# Patient Record
Sex: Female | Born: 1980
Health system: Southern US, Community
[De-identification: ages and names within clinical notes are randomized; demographics above are authoritative.]

## PROBLEM LIST (undated history)

## (undated) DIAGNOSIS — N200 Calculus of kidney: Secondary | ICD-10-CM

## (undated) DIAGNOSIS — G43909 Migraine, unspecified, not intractable, without status migrainosus: Secondary | ICD-10-CM

## (undated) HISTORY — PX: NO PAST SURGERIES: SHX2092

---

## 2018-07-17 ENCOUNTER — Emergency Department (HOSPITAL_BASED_OUTPATIENT_CLINIC_OR_DEPARTMENT_OTHER)
Admission: EM | Admit: 2018-07-17 | Discharge: 2018-07-17 | Disposition: A | Discharge status: Home / Self Care | Payer: 59 | Attending: Emergency Medicine | Admitting: Emergency Medicine

## 2018-07-17 ENCOUNTER — Encounter (HOSPITAL_BASED_OUTPATIENT_CLINIC_OR_DEPARTMENT_OTHER): Payer: Self-pay | Admitting: Emergency Medicine

## 2018-07-17 ENCOUNTER — Other Ambulatory Visit: Payer: Self-pay

## 2018-07-17 ENCOUNTER — Emergency Department (HOSPITAL_BASED_OUTPATIENT_CLINIC_OR_DEPARTMENT_OTHER): Payer: 59

## 2018-07-17 DIAGNOSIS — R1084 Generalized abdominal pain: Secondary | ICD-10-CM | POA: Diagnosis present

## 2018-07-17 DIAGNOSIS — D259 Leiomyoma of uterus, unspecified: Secondary | ICD-10-CM

## 2018-07-17 DIAGNOSIS — N200 Calculus of kidney: Secondary | ICD-10-CM

## 2018-07-17 DIAGNOSIS — N132 Hydronephrosis with renal and ureteral calculous obstruction: Secondary | ICD-10-CM | POA: Diagnosis not present

## 2018-07-17 HISTORY — DX: Migraine, unspecified, not intractable, without status migrainosus: G43.909

## 2018-07-17 LAB — CBC WITH DIFFERENTIAL/PLATELET
BASOS ABS: 0 10*3/uL (ref 0.0–0.1)
BASOS PCT: 0 %
Eosinophils Absolute: 0.1 10*3/uL (ref 0.0–0.7)
Eosinophils Relative: 0 %
HEMATOCRIT: 39.4 % (ref 36.0–46.0)
HEMOGLOBIN: 13.7 g/dL (ref 12.0–15.0)
Lymphocytes Relative: 15 %
Lymphs Abs: 2.1 10*3/uL (ref 0.7–4.0)
MCH: 29.3 pg (ref 26.0–34.0)
MCHC: 34.8 g/dL (ref 30.0–36.0)
MCV: 84.2 fL (ref 78.0–100.0)
Monocytes Absolute: 1 10*3/uL (ref 0.1–1.0)
Monocytes Relative: 7 %
NEUTROS ABS: 11.4 10*3/uL — AB (ref 1.7–7.7)
NEUTROS PCT: 78 %
Platelets: 404 10*3/uL — ABNORMAL HIGH (ref 150–400)
RBC: 4.68 MIL/uL (ref 3.87–5.11)
RDW: 14.2 % (ref 11.5–15.5)
WBC: 14.6 10*3/uL — ABNORMAL HIGH (ref 4.0–10.5)

## 2018-07-17 LAB — COMPREHENSIVE METABOLIC PANEL
ALK PHOS: 76 U/L (ref 38–126)
ALT: 24 U/L (ref 0–44)
AST: 22 U/L (ref 15–41)
Albumin: 4.3 g/dL (ref 3.5–5.0)
Anion gap: 10 (ref 5–15)
BILIRUBIN TOTAL: 0.5 mg/dL (ref 0.3–1.2)
BUN: 13 mg/dL (ref 6–20)
CO2: 24 mmol/L (ref 22–32)
Calcium: 9.3 mg/dL (ref 8.9–10.3)
Chloride: 103 mmol/L (ref 98–111)
Creatinine, Ser: 0.87 mg/dL (ref 0.44–1.00)
GFR calc Af Amer: >60 mL/min (ref >60)
GFR calc non Af Amer: >60 mL/min (ref >60)
GLUCOSE: 101 mg/dL — AB (ref 70–99)
POTASSIUM: 4.1 mmol/L (ref 3.5–5.1)
Sodium: 137 mmol/L (ref 135–145)
TOTAL PROTEIN: 8.1 g/dL (ref 6.5–8.1)

## 2018-07-17 LAB — URINALYSIS, ROUTINE W REFLEX MICROSCOPIC
Bilirubin Urine: NEGATIVE
Glucose, UA: NEGATIVE mg/dL
Ketones, ur: NEGATIVE mg/dL
NITRITE: NEGATIVE
PROTEIN: NEGATIVE mg/dL
Specific Gravity, Urine: <1.005 — ABNORMAL LOW (ref 1.005–1.030)
pH: 7 (ref 5.0–8.0)

## 2018-07-17 LAB — URINALYSIS, MICROSCOPIC (REFLEX)

## 2018-07-17 LAB — LIPASE, BLOOD: Lipase: 30 U/L (ref 11–51)

## 2018-07-17 LAB — PREGNANCY, URINE: PREG TEST UR: NEGATIVE

## 2018-07-17 MED ORDER — KETOROLAC TROMETHAMINE 30 MG/ML IJ SOLN
30.0000 mg | Freq: Once | INTRAMUSCULAR | Status: AC
  Administered 2018-07-17: 30 mg via INTRAVENOUS
  Filled 2018-07-17: qty 1

## 2018-07-17 MED ORDER — SODIUM CHLORIDE 0.9 % IV BOLUS
1000.0000 mL | Freq: Once | INTRAVENOUS | Status: AC
  Administered 2018-07-17: 1000 mL via INTRAVENOUS

## 2018-07-17 MED ORDER — ONDANSETRON 4 MG PO TBDP: 4.0000 mg | ORAL_TABLET | Freq: Three times a day (TID) | ORAL | 0 refills | Status: DC | PRN

## 2018-07-17 MED ORDER — TRAMADOL HCL 50 MG PO TABS: 50.0000 mg | ORAL_TABLET | Freq: Four times a day (QID) | ORAL | 0 refills | Status: AC | PRN

## 2018-07-17 MED ORDER — CEPHALEXIN 500 MG PO CAPS: 500.0000 mg | ORAL_CAPSULE | Freq: Three times a day (TID) | ORAL | 0 refills | Status: AC

## 2018-07-17 MED FILL — CEPHALEXIN 500 MG CAPSULE: 500 | 5 days supply | Qty: 15 | Fill #0

## 2018-07-17 MED FILL — traMADol HCL 50 MG TABS: 50 | 4 days supply | Qty: 15 | Fill #0

## 2018-07-17 MED FILL — ONDANSETRON ODT 4 MG TABLET: 4 | 7 days supply | Qty: 20 | Fill #0

## 2018-07-17 NOTE — ED Triage Notes (Signed)
Pt is having back pain and abdominal pain that made her nauseous. She took magnesia without relief

## 2018-07-17 NOTE — ED Notes (Signed)
ED Provider at bedside. 

## 2018-07-17 NOTE — ED Provider Notes (Signed)
[facility]ER[facility] DEPARTMENT Provider Note   CSN: [id number] Arrival date & time: 07/17/18  0734     History   Chief Complaint Chief Complaint  Patient presents with  . Abdominal Pain    HPI Jamie Preston is a 37 y.o. female.  HPI   37yo female with history of migraines presents with concern for flank pain.  Pain started last night around 8, rated 8/10/waxing and waning down to 6/10, described as cramping.  Pain is located left flank and radiation to left suprapubic area.  Feels better walking. Tried milk of magnesia without relief. Passing gas. Had BM at [place], nothing since. Denies nausea, vomiting, urinary symptoms, vaginal discharge or bleeding. Sister has hx of nephrolithiasis. No personal hx of this. No hx of abd surgeries.      Past Medical History:  Diagnosis Date  . Migraine     There are no active problems to display for this patient.   History reviewed. No pertinent surgical history.   OB History   None      Home Medications    Prior to Admission medications   Medication Sig Start Date End Date Taking? Authorizing Provider  cephALEXin (KEFLEX) 500 MG capsule Take 1 capsule (500 mg total) by mouth 3 (three) times daily for 5 days. 07/17/18 07/22/18  Gareth Morgan, MD  ondansetron (ZOFRAN ODT) 4 MG disintegrating tablet Take 1 tablet (4 mg total) by mouth every 8 (eight) hours as needed for nausea or vomiting. 07/17/18   Gareth Morgan, MD  traMADol (ULTRAM) 50 MG tablet Take 1 tablet (50 mg total) by mouth every 6 (six) hours as needed. 07/17/18   Gareth Morgan, MD    Family History No family history on file.  Social History Social History   Tobacco Use  . Smoking status: Never Smoker  . Smokeless tobacco: Never Used  Substance Use Topics  . Alcohol use: Never    Frequency: Never  . Drug use: Never     Allergies   Percocet [oxycodone-acetaminophen]   Review of Systems Review of Systems  Constitutional: Negative for fever.    HENT: Negative for sore throat.   Eyes: Negative for visual disturbance.  Respiratory: Negative for cough and shortness of breath.   Cardiovascular: Negative for chest pain.  Gastrointestinal: Positive for abdominal pain. Negative for constipation, diarrhea, nausea and vomiting.  Genitourinary: Positive for flank pain. Negative for difficulty urinating, vaginal bleeding and vaginal discharge.  Musculoskeletal: Negative for back pain and neck pain.  Skin: Negative for rash.  Neurological: Negative for syncope and headaches.     Physical Exam Updated Vital Signs BP (!) 156/95 (BP Location: Right Arm)   Pulse 77   Temp 98.1 F (36.7 C) (Oral)   Resp 18   Ht 5\' 7"  (1.702 m)   Wt 108.9 kg   LMP 07/15/2018   SpO2 99%   BMI 37.59 kg/m   Physical Exam  Constitutional: She is oriented to person, place, and time. She appears well-developed and well-nourished. No distress.  HENT:  Head: Normocephalic and atraumatic.  Eyes: Conjunctivae and EOM are normal.  Neck: Normal range of motion.  Cardiovascular: Normal rate, regular rhythm, normal heart sounds and intact distal pulses. Exam reveals no gallop and no friction rub.  No murmur heard. Pulmonary/Chest: Effort normal and breath sounds normal. No respiratory distress. She has no wheezes. She has no rales.  Abdominal: Soft. She exhibits no distension. There is no tenderness. There is CVA tenderness (left). There is no  guarding.  Musculoskeletal: She exhibits no edema or tenderness.  Neurological: She is alert and oriented to person, place, and time.  Skin: Skin is warm and dry. No rash noted. She is not diaphoretic. No erythema.  Nursing note and vitals reviewed.    ED Treatments / Results  Labs (all labs ordered are listed, but only abnormal results are displayed) Labs Reviewed  URINALYSIS, ROUTINE W REFLEX MICROSCOPIC - Abnormal; Notable for the following components:      Result Value   Specific Gravity, Urine <1.005 (*)     Hgb urine dipstick MODERATE (*)    Leukocytes, UA SMALL (*)    All other components within normal limits  CBC WITH DIFFERENTIAL/PLATELET - Abnormal; Notable for the following components:   WBC 14.6 (*)    Platelets 404 (*)    Neutro Abs 11.4 (*)    All other components within normal limits  COMPREHENSIVE METABOLIC PANEL - Abnormal; Notable for the following components:   Glucose, Bld 101 (*)    All other components within normal limits  URINALYSIS, MICROSCOPIC (REFLEX) - Abnormal; Notable for the following components:   Bacteria, UA FEW (*)    All other components within normal limits  URINE CULTURE  PREGNANCY, URINE  LIPASE, BLOOD    EKG None  Radiology Ct Renal Stone Study  Result Date: 07/17/2018 CLINICAL DATA:  Abdominal and flank pain EXAM: CT ABDOMEN AND PELVIS WITHOUT CONTRAST TECHNIQUE: Multidetector CT imaging of the abdomen and pelvis was performed following the standard protocol without oral or IV contrast. COMPARISON:  None. FINDINGS: Lower chest: There is atelectatic change in the visualized right lower lobe. Lung bases elsewhere clear. Hepatobiliary: There is evidence of a degree of hepatic steatosis. No focal liver lesions are evident. Gallbladder wall is not appreciably thickened. There is no biliary duct dilatation. Pancreas: No pancreatic mass or inflammatory focus. Spleen: No splenic lesions are evident. Adrenals/Urinary Tract: Right adrenal appears normal. There is a 1 x 1 cm adenoma arising from medial left adrenal. The left kidney is edematous. There is extensive perinephric fluid on the left. There is moderate hydronephrosis on the left. There is no evident renal mass on either side. There is a 1 mm calculus in the upper pole of the right kidney. There is a 4 x 4 mm calculus in the mid right kidney. There is a 1 mm calculus in the lower pole right kidney. There is a 1 mm calculus in the lower pole of the left kidney. There is a calculus in the proximal left ureter  measuring 3 x 3 mm at the level of L3-4. No other ureteral calculi are evident on either side. Urinary bladder is midline with wall thickness within normal limits. Stomach/Bowel: There is no appreciable bowel wall or mesenteric thickening. No evident bowel obstruction. No free air or portal venous air. Vascular/Lymphatic: No abdominal aortic aneurysm. No vascular lesions are evident on this noncontrast enhanced study. There is a retroaortic left renal vein, an anatomic variant. There is no adenopathy in the abdomen or pelvis. Subcentimeter inguinal lymph nodes are considered nonspecific. Reproductive: Uterus is anteverted. Uterus is enlarged with several mass lesions evident on this noncontrast enhanced CT consistent with diffuse leiomyomatous change. It is difficult to confidently separate ovaries from uterus. There are what appear to be cystic areas, likely arising from the ovaries, measuring as large as 3 x 3 cm. Other: Appendix appears normal. No abscess or ascites is evident in the abdomen or pelvis. There is a minimal ventral  hernia containing only fat. Musculoskeletal: There is degenerative change in the lumbar spine with vacuum phenomenon at L5-S1. There are no blastic or lytic bone lesions. No intramuscular lesions are evident. IMPRESSION: 1. 3 mm calculus at L3-4 in the proximal left ureter. Moderate hydronephrosis and proximal ureterectasis noted. Note that the left kidney is edematous with perinephric fluid. Suspect recent left-sided pelvicalyceal rupture accounting for the fluid. 2. Nonobstructing calculi in each kidney, more on the right than on the left. 3. Enlarged leiomyomatous uterus. Mildly prominent cystic areas which may arise from adjacent ovaries. It is difficult to separate ovaries from uterus on this study. It is possible that the cystic areas arise from eccentric leiomyomas from the uterus as opposed to ovaries. Pelvic ultrasound nonemergently to further evaluate may be helpful in this  regard. 4.  Hepatic steatosis. 5. No evident bowel obstruction. No abscess in the abdomen or pelvis. Appendix appears normal. 6.  Minimal ventral hernia containing only fat. 7.  1 cm benign adenoma left adrenal. Electronically Signed   By: Lowella Grip III M.D.   On: 07/17/2018 08:57    Procedures Procedures (including critical care time)  Medications Ordered in ED Medications  sodium chloride 0.9 % bolus 1,000 mL ( Intravenous Stopped 07/17/18 0927)  ketorolac (TORADOL) 30 MG/ML injection 30 mg (30 mg Intravenous Given 07/17/18 0823)     Initial Impression / Assessment and Plan / ED Course  I have reviewed the triage vital signs and the nursing notes.  Pertinent labs & imaging results that were available during my care of the patient were reviewed by me and considered in my medical decision making (see chart for details).    37yo female with history of migraines presents with concern for flank pain.  DDx includes nephrolithiasis, pyelonephritis, constipation, gastritis, pancreatitis.  Labs show no sign of hepatitis or pancreatitis.  No prior hx of nephrolithiasis, will order CT.    CT shows nephrolithiasis with perinephric edema, small amount of fluid, concern for pelvicalyceal rupture.  Given IV fluids and toradol with improvement of symptoms.  Urinalysis without signs of infection.  Recommend ibuprofen, tylenol, given rx for zofran.  Given rx for tramadol in case symptoms not controlled.  Discussed with Dr. Lovena Neighbours of Urology given pelvicalyceal rupture, given pt hemodynamically stable with pain controlled, recommends outpatient follow up and strict return precautions. Given rx for abx per Urology recommendations. Patient comfortable with plan. Will follow up with [facility] incidental finding of uterine fibroids versus ovarian cystic abnormalities for pelvic US.  Patient discharged in stable condition with understanding of reasons to return.    Final Clinical Impressions(s) / ED  Diagnoses   Final diagnoses:  Nephrolithiasis  Uterine leiomyoma, unspecified location    ED Discharge Orders         Ordered    cephALEXin (KEFLEX) 500 MG capsule  3 times daily     07/17/18 1006    traMADol (ULTRAM) 50 MG tablet  Every 6 hours PRN     07/17/18 1018    ondansetron (ZOFRAN ODT) 4 MG disintegrating tablet  Every 8 hours PRN     07/17/18 1018           Gareth Morgan, MD 07/17/18 1037

## 2018-07-17 NOTE — ED Notes (Signed)
ED Provider at bedside discussing test results and plan of care. 

## 2018-07-17 NOTE — ED Notes (Signed)
Patient transported to CT 

## 2018-07-18 LAB — URINE CULTURE: Culture: <10000 — AB

## 2018-07-29 DIAGNOSIS — D3502 Benign neoplasm of left adrenal gland: Secondary | ICD-10-CM | POA: Diagnosis not present

## 2018-07-29 DIAGNOSIS — N202 Calculus of kidney with calculus of ureter: Secondary | ICD-10-CM | POA: Diagnosis not present

## 2018-08-06 ENCOUNTER — Other Ambulatory Visit: Payer: Self-pay

## 2018-08-06 ENCOUNTER — Emergency Department (HOSPITAL_BASED_OUTPATIENT_CLINIC_OR_DEPARTMENT_OTHER)
Admission: EM | Admit: 2018-08-06 | Discharge: 2018-08-07 | Disposition: A | Discharge status: Home / Self Care | Payer: 59 | Attending: Emergency Medicine | Admitting: Emergency Medicine

## 2018-08-06 ENCOUNTER — Emergency Department (HOSPITAL_BASED_OUTPATIENT_CLINIC_OR_DEPARTMENT_OTHER): Payer: 59

## 2018-08-06 ENCOUNTER — Encounter (HOSPITAL_BASED_OUTPATIENT_CLINIC_OR_DEPARTMENT_OTHER): Payer: Self-pay | Admitting: Emergency Medicine

## 2018-08-06 DIAGNOSIS — Y939 Activity, unspecified: Secondary | ICD-10-CM | POA: Insufficient documentation

## 2018-08-06 DIAGNOSIS — W19XXXA Unspecified fall, initial encounter: Secondary | ICD-10-CM | POA: Insufficient documentation

## 2018-08-06 DIAGNOSIS — Y92009 Unspecified place in unspecified non-institutional (private) residence as the place of occurrence of the external cause: Secondary | ICD-10-CM | POA: Diagnosis not present

## 2018-08-06 DIAGNOSIS — M79604 Pain in right leg: Secondary | ICD-10-CM | POA: Diagnosis not present

## 2018-08-06 DIAGNOSIS — S8011XA Contusion of right lower leg, initial encounter: Secondary | ICD-10-CM | POA: Diagnosis not present

## 2018-08-06 DIAGNOSIS — M7989 Other specified soft tissue disorders: Secondary | ICD-10-CM | POA: Diagnosis not present

## 2018-08-06 DIAGNOSIS — S8991XA Unspecified injury of right lower leg, initial encounter: Secondary | ICD-10-CM | POA: Diagnosis not present

## 2018-08-06 DIAGNOSIS — Y999 Unspecified external cause status: Secondary | ICD-10-CM | POA: Diagnosis not present

## 2018-08-06 DIAGNOSIS — M25561 Pain in right knee: Secondary | ICD-10-CM | POA: Diagnosis not present

## 2018-08-06 HISTORY — DX: Calculus of kidney: N20.0

## 2018-08-06 MED ORDER — OXYCODONE HCL 5 MG PO TABS
5.0000 mg | ORAL_TABLET | Freq: Once | ORAL | Status: AC
  Administered 2018-08-06: 5 mg via ORAL
  Filled 2018-08-06: qty 1

## 2018-08-06 MED ORDER — ACETAMINOPHEN 500 MG PO TABS
1000.0000 mg | ORAL_TABLET | Freq: Once | ORAL | Status: AC
  Administered 2018-08-06: 1000 mg via ORAL
  Filled 2018-08-06: qty 2

## 2018-08-06 NOTE — ED Triage Notes (Signed)
Pt states she was playing with her kids and was running and fell  Pt has swelling and bruising noted to her right leg just below her knee on the right side  Pt states she was able to walk on it at first but now the pain is worse and the swelling has increased

## 2018-08-06 NOTE — ED Notes (Signed)
ED Provider at bedside. 

## 2018-08-06 NOTE — ED Notes (Signed)
Ice pack applied.

## 2018-08-06 NOTE — Discharge Instructions (Signed)
Please keep your left elevated above the level of your heart.  Return to the ED immediately if your pain suddenly worsens, you have tingling to your leg, you feel that your leg is numb or if you are having trouble moving it.

## 2018-08-06 NOTE — ED Notes (Signed)
PMS intact before and after. Pt tolerated well. All questions answered. 

## 2018-08-06 NOTE — ED Provider Notes (Signed)
[facility] Provider Note   CSN: [id number] Arrival date & time: 08/06/18  2130     History   Chief Complaint Chief Complaint  Patient presents with  . Fall  . Leg Injury    HPI Jamie Preston is a 37 y.o. female.  37 yo F with a chief complaint of right lower leg pain.  The patient had a mechanical fall and landed on her right lower leg.  She had some pain but was able to get around the house for the next few hours but noticed that the leg had gotten significantly swollen much more painful.  She denies numbness or tingling.  Denies any other area of injury.  The history is provided by the patient.  Fall  This is a new problem. The current episode started 3 to 5 hours ago. The problem occurs rarely. The problem has been rapidly worsening. Pertinent negatives include no chest pain, no headaches and no shortness of breath. Nothing aggravates the symptoms. Nothing relieves the symptoms. She has tried a cold compress for the symptoms. The treatment provided mild relief.    Past Medical History:  Diagnosis Date  . Kidney stones   . Migraine     There are no active problems to display for this patient.   History reviewed. No pertinent surgical history.   OB History   None      Home Medications    Prior to Admission medications   Medication Sig Start Date End Date Taking? Authorizing Provider  ondansetron (ZOFRAN ODT) 4 MG disintegrating tablet Take 1 tablet (4 mg total) by mouth every 8 (eight) hours as needed for nausea or vomiting. 07/17/18   Gareth Morgan, MD  traMADol (ULTRAM) 50 MG tablet Take 1 tablet (50 mg total) by mouth every 6 (six) hours as needed. 07/17/18   Gareth Morgan, MD    Family History History reviewed. No pertinent family history.  Social History Social History   Tobacco Use  . Smoking status: Never Smoker  . Smokeless tobacco: Never Used  Substance Use Topics  . Alcohol use: Never    Frequency: Never  . Drug  use: Never     Allergies   Percocet [oxycodone-acetaminophen]   Review of Systems Review of Systems  Constitutional: Negative for chills and fever.  HENT: Negative for congestion and rhinorrhea.   Eyes: Negative for redness and visual disturbance.  Respiratory: Negative for shortness of breath and wheezing.   Cardiovascular: Negative for chest pain and palpitations.  Gastrointestinal: Negative for nausea and vomiting.  Genitourinary: Negative for dysuria and urgency.  Musculoskeletal: Positive for arthralgias and myalgias.  Skin: Negative for pallor and wound.  Neurological: Negative for dizziness and headaches.     Physical Exam Updated Vital Signs BP (!) 160/97 (BP Location: Right Arm)   Pulse 84   Temp 99.9 F (37.7 C) (Oral)   Resp 18   Ht 5\' 7"  (1.702 m)   Wt 108.9 kg   LMP 08/02/2018 (Exact Date)   SpO2 97%   BMI 37.59 kg/m   Physical Exam  Constitutional: She is oriented to person, place, and time. She appears well-developed and well-nourished. No distress.  HENT:  Head: Normocephalic and atraumatic.  Eyes: Pupils are equal, round, and reactive to light. EOM are normal.  Neck: Normal range of motion. Neck supple.  Cardiovascular: Normal rate and regular rhythm. Exam reveals no gallop and no friction rub.  No murmur heard. Pulmonary/Chest: Effort normal. She has no wheezes. She  has no rales.  Abdominal: Soft. She exhibits no distension. There is no tenderness.  Musculoskeletal: She exhibits edema and tenderness.  Tenderness and swelling to the right lower leg just below the knee.  PMS intact distally.  Pain with flexion of the knee, difficult exam due to tenderness.  There is no pain with passive flexion of the ankle.  She has intact pulse motor and sensation.  Neurological: She is alert and oriented to person, place, and time.  Skin: Skin is warm and dry. She is not diaphoretic.  Psychiatric: She has a normal mood and affect. Her behavior is normal.    Nursing note and vitals reviewed.    ED Treatments / Results  Labs (all labs ordered are listed, but only abnormal results are displayed) Labs Reviewed - No data to display  EKG None  Radiology Dg Tibia/fibula Right  Result Date: 08/06/2018 CLINICAL DATA:  Swelling and pain status post fall. EXAM: RIGHT TIBIA AND FIBULA - 2 VIEW COMPARISON:  None. FINDINGS: Soft tissue swelling of the anterolateral aspect of the included knee and proximal leg. No fracture or joint dislocation. No suspicious osseous lesions. IMPRESSION: Soft tissue contusions of the anterolateral aspect of the included knee and proximal leg. No fracture or joint dislocation. Electronically Signed   By: Ashley Royalty M.D.   On: 08/06/2018 23:37   Dg Knee Complete 4 Views Right  Result Date: 08/06/2018 CLINICAL DATA:  Right knee pain after fall EXAM: RIGHT KNEE - COMPLETE 4+ VIEW COMPARISON:  None. FINDINGS: No evidence of fracture, dislocation, or joint effusion. No evidence of arthropathy or other focal bone abnormality. Moderate degree of soft tissue swelling along the lateral aspect of the knee and included calf as well as pretibial soft tissues is identified. IMPRESSION: Soft tissue swelling along the lateral aspect of the included knee and calf as well as pretibial soft tissues compatible with soft tissue contusions. No acute osseous abnormality. Electronically Signed   By: Ashley Royalty M.D.   On: 08/06/2018 23:32    Procedures Procedures (including critical care time)  Medications Ordered in ED Medications  acetaminophen (TYLENOL) tablet 1,000 mg (1,000 mg Oral Given 08/06/18 2243)  oxyCODONE (Oxy IR/ROXICODONE) immediate release tablet 5 mg (5 mg Oral Given 08/06/18 2244)     Initial Impression / Assessment and Plan / ED Course  I have reviewed the triage vital signs and the nursing notes.  Pertinent labs & imaging results that were available during my care of the patient were reviewed by me and considered in my  medical decision making (see chart for details).     37 yo F with a chief complaint of right lower extremity pain.  This is status post fall.  Is complaining of pain and swelling to the right leg.  Seems unlikely to be fractured as she was able to ambulate on it 2 hours post but she has marked swelling on my exam.  Will obtain a plain film.  Plain film is negative for fracture.  The patient is reassessed and the swelling appears to be about the same.  She has significant edema though her compartments appear to be soft.  She has no pain with passive flexion of the ankle.  She has intact pulse motor and sensation distally.  I discussed with her that the emergent condition would be compartment syndrome.  I do not feel that she has that currently.  We will immobilize her in a knee immobilizer and give her crutches.  We will have  her keep this over the level of her heart.  I discussed with her at length that things to return for including increasing pain tingling numbness or inability to move her toes or leg.    11:43 PM:  I have discussed the diagnosis/risks/treatment options with the patient and family and believe the pt to be eligible for discharge home to follow-up with PCP. We also discussed returning to the ED immediately if new or worsening sx occur. We discussed the sx which are most concerning (e.g., sudden worsening pain, numbness, tingling, paralysis) that necessitate immediate return. Medications administered to the patient during their visit and any new prescriptions provided to the patient are listed below.  Medications given during this visit Medications  acetaminophen (TYLENOL) tablet 1,000 mg (1,000 mg Oral Given 08/06/18 2243)  oxyCODONE (Oxy IR/ROXICODONE) immediate release tablet 5 mg (5 mg Oral Given 08/06/18 2244)      The patient appears reasonably screen and/or stabilized for discharge and I doubt any other medical condition or other [facility] requiring further screening, evaluation, or  treatment in the ED at this time prior to discharge.    Final Clinical Impressions(s) / ED Diagnoses   Final diagnoses:  Leg hematoma, right, initial encounter    ED Discharge Orders    None       Deno Etienne, DO 08/06/18 2343

## 2018-08-17 ENCOUNTER — Encounter: Payer: Self-pay | Admitting: Advanced Practice Midwife

## 2018-08-17 ENCOUNTER — Ambulatory Visit (INDEPENDENT_AMBULATORY_CARE_PROVIDER_SITE_OTHER): Payer: 59 | Admitting: Advanced Practice Midwife

## 2018-08-17 VITALS — BP 135/85 | HR 87 | Ht 67.0 in | Wt 243.1 lb

## 2018-08-17 DIAGNOSIS — Z124 Encounter for screening for malignant neoplasm of cervix: Secondary | ICD-10-CM | POA: Diagnosis not present

## 2018-08-17 DIAGNOSIS — Z01419 Encounter for gynecological examination (general) (routine) without abnormal findings: Secondary | ICD-10-CM | POA: Diagnosis not present

## 2018-08-17 DIAGNOSIS — Z1151 Encounter for screening for human papillomavirus (HPV): Secondary | ICD-10-CM

## 2018-08-17 DIAGNOSIS — D219 Benign neoplasm of connective and other soft tissue, unspecified: Secondary | ICD-10-CM | POA: Diagnosis not present

## 2018-08-17 DIAGNOSIS — Z113 Encounter for screening for infections with a predominantly sexual mode of transmission: Secondary | ICD-10-CM | POA: Diagnosis not present

## 2018-08-17 NOTE — Progress Notes (Signed)
GYNECOLOGY ANNUAL PREVENTATIVE CARE ENCOUNTER NOTE  Subjective:   Jamie Preston is a 37 y.o. G0P0000 female here for a routine annual gynecologic exam.  Current complaints: none.   Denies abnormal vaginal bleeding, discharge, pelvic pain, problems with intercourse or other gynecologic concerns.   Periods a little more heavy in the last 1-1.5 years. Had fibroids noted on CT scan, and recommended FU with GYN. Denies any pain.    Gynecologic History Patient's last menstrual period was 08/04/2018 (approximate). Contraception: none Last Pap: approx 15 years ago. Results were: unknown  Last mammogram: NA. Results were: NA  Obstetric History OB History  Gravida Para Term Preterm AB Living  0 0 0 0 0 0  SAB TAB Ectopic Multiple Live Births  0 0 0 0 0    Past Medical History:  Diagnosis Date  . Kidney stones   . Kidney stones   . Migraine     Past Surgical History:  Procedure Laterality Date  . NO PAST SURGERIES      Current Outpatient Medications on File Prior to Visit  Medication Sig Dispense Refill  . traMADol (ULTRAM) 50 MG tablet Take 1 tablet (50 mg total) by mouth every 6 (six) hours as needed. 15 tablet 0   No current facility-administered medications on file prior to visit.     Allergies  Allergen Reactions  . Percocet [Oxycodone-Acetaminophen] Nausea And Vomiting    Social History   Socioeconomic History  . Marital status: Single    Spouse name: Not on file  . Number of children: Not on file  . Years of education: Not on file  . Highest education level: Not on file  Occupational History  . Not on file  Social Needs  . Financial resource strain: Not on file  . Food insecurity:    Worry: Not on file    Inability: Not on file  . Transportation needs:    Medical: Not on file    Non-medical: Not on file  Tobacco Use  . Smoking status: Never Smoker  . Smokeless tobacco: Never Used  Substance and Sexual Activity  . Alcohol use: Never    Frequency: Never  .  Drug use: Never  . Sexual activity: Not Currently    Birth control/protection: None  Lifestyle  . Physical activity:    Days per week: Not on file    Minutes per session: Not on file  . Stress: Not on file  Relationships  . Social connections:    Talks on phone: Not on file    Gets together: Not on file    Attends religious service: Not on file    Active member of club or organization: Not on file    Attends meetings of clubs or organizations: Not on file    Relationship status: Not on file  . Intimate partner violence:    Fear of current or ex partner: Not on file    Emotionally abused: Not on file    Physically abused: Not on file    Forced sexual activity: Not on file  Other Topics Concern  . Not on file  Social History Narrative  . Not on file    History reviewed. No pertinent family history.  The following portions of the patient's history were reviewed and updated as appropriate: allergies, current medications, past family history, past medical history, past social history, past surgical history and problem list.  Review of Systems Pertinent items noted in HPI and remainder of comprehensive ROS otherwise negative.  Objective:  BP 135/85   Pulse 87   Ht 5\' 7"  (1.702 m)   Wt 243 lb 1.6 oz (110.3 kg)   LMP 08/04/2018 (Approximate)   BMI 38.07 kg/m  CONSTITUTIONAL: Well-developed, well-nourished female in no acute distress.  HENT:  Normocephalic, atraumatic, External right and left ear normal. Oropharynx is clear and moist EYES: Conjunctivae and EOM are normal. Pupils are equal, round, and reactive to light. No scleral icterus.  NECK: Normal range of motion, supple, no masses.  Normal thyroid.  SKIN: Skin is warm and dry. No rash noted. Not diaphoretic. No erythema. No pallor. NEUROLOGIC: Alert and oriented to person, place, and time. Normal reflexes, muscle tone coordination. No cranial nerve deficit noted. PSYCHIATRIC: Normal mood and affect. Normal behavior.  Normal judgment and thought content. CARDIOVASCULAR: Normal heart rate noted, regular rhythm RESPIRATORY: Clear to auscultation bilaterally. Effort and breath sounds normal, no problems with respiration noted. BREASTS: Symmetric in size. No masses, skin changes, nipple drainage, or lymphadenopathy. ABDOMEN: Soft, normal bowel sounds, no distention noted.  No tenderness, rebound or guarding.  PELVIC: Normal appearing external genitalia; normal appearing vaginal mucosa and cervix.  No abnormal discharge noted.  Pap smear obtained.  Uterus significantly enlarged, approx 18-20 week size  MUSCULOSKELETAL: Normal range of motion. No tenderness.  No cyanosis, clubbing, or edema.  2+ distal pulses.   Assessment and Plan:  1. Well woman exam with routine gynecological exam - Cytology - PAP  2. Fibroids - Consider pelvic US in future if fibroids become symptomatic    Will follow up results of pap smear and manage accordingly. Mammogram scheduled Routine preventative health maintenance measures emphasized. Please refer to After Visit Summary for other counseling recommendations.

## 2018-08-19 ENCOUNTER — Other Ambulatory Visit: Payer: Self-pay

## 2018-08-19 ENCOUNTER — Encounter: Payer: Self-pay | Admitting: Family Medicine

## 2018-08-19 ENCOUNTER — Ambulatory Visit: Payer: 59 | Admitting: Family Medicine

## 2018-08-19 VITALS — BP 140/87 | HR 82 | Temp 98.2°F | Ht 67.0 in | Wt 248.4 lb

## 2018-08-19 DIAGNOSIS — S8012XA Contusion of left lower leg, initial encounter: Secondary | ICD-10-CM | POA: Diagnosis not present

## 2018-08-19 DIAGNOSIS — R2241 Localized swelling, mass and lump, right lower limb: Secondary | ICD-10-CM | POA: Diagnosis not present

## 2018-08-19 LAB — CYTOLOGY - PAP
CHLAMYDIA, DNA PROBE: NEGATIVE
DIAGNOSIS: NEGATIVE
HPV: NOT DETECTED
Neisseria Gonorrhea: NEGATIVE

## 2018-08-19 MED ORDER — IBUPROFEN 600 MG PO TABS: 600.0000 mg | ORAL_TABLET | Freq: Three times a day (TID) | ORAL | 0 refills | Status: AC | PRN

## 2018-08-19 NOTE — Patient Instructions (Addendum)
If you have lab work done today you will be contacted with your lab results within the next 2 weeks.  If you have not heard from [place] then please contact us. The fastest way to get your results is to register for My Chart.   IF you received an x-ray today, you will receive an invoice from [facility]. Please contact [facility] at [phone] with questions or concerns regarding your invoice.   IF you received labwork today, you will receive an invoice from [place]. Please contact [facility] at [phone] with questions or concerns regarding your invoice.   Our billing staff will not be able to assist you with questions regarding bills from these companies.  You will be contacted with the lab results as soon as they are available. The fastest way to get your results is to activate your My Chart account. Instructions are located on the last page of this paperwork. If you have not heard from [place] regarding the results in 2 weeks, please contact this office.     Hematoma A hematoma is a collection of blood under the skin, in an organ, in a body space, in a joint space, or in other tissue. The blood can thicken (clot) to form a lump that you can see and feel. The lump is often firm and may become sore and tender. Most hematomas get better in a few days to weeks. However, some hematomas may be serious and require medical care. Hematomas can range from very small to very large. What are the causes? This condition is caused by:  A blunt or penetrating injury.  A leakage from a blood vessel under the skin. This leak happens on its own (is spontaneous) and is more likely to occur in older people, especially those who take blood thinners.  Some medical procedures including surgeries, such as oral surgery, face lifts, and surgeries that involve the joints.  Some medical conditions that cause bleeding or bruising problems. There may be multiple hematomas that appear in different  areas of the body.  What are the signs or symptoms? Symptoms of this condition can depend on where the hematoma is located. Common symptoms of a hematoma under the skin include:  A firm lump on the body.  Pain and tenderness in the area.  Bruising. Blue, dark blue, purple-red, or yellowish skin (discoloration) may appear at the site of the hematoma if the hematoma is close to the surface of the skin.  For hematomas in deeper tissues or body spaces, symptoms may be less obvious. A collection of blood in the stomach (intra-abdominal hematoma) may cause pain in the abdomen, weakness, fainting, and shortness of breath. A collection of blood in the head (intracranial hematoma) may cause a headache or symptoms such as weakness, trouble speaking or understanding, or a change in consciousness. How is this diagnosed? This condition is diagnosed based on:  Your medical history.  A physical exam.  Imaging tests, such as an ultrasonogram or CT scan. These may be needed if your health care provider suspects a hematoma in deeper tissues or body spaces.  Blood tests. These may be needed if your health care provider believes that the hematoma is caused by a medical condition.  How is this treated? This condition usually does not need treatment because many hematomas go away on their own over time. However, large hematomas, or those that may affect vital organs, may need surgical drainage or monitoring. If the hematoma is caused by a medical  condition, medicines may be prescribed. Follow these instructions at home: Managing pain, stiffness, and swelling  If directed, apply ice to the affected area: ? Put ice in a plastic bag. ? Place a towel between your skin and the bag. ? Leave the ice on for 20 minutes, 2-3 times a day for the first couple of days.  After applying ice for a couple of days, your health care provider may recommend that you apply warm compresses to the affected area instead. Do this  as told by your health care provider. Remove the heat if your skin turns bright red. This is especially important if you are unable to feel pain, heat, or cold. You may have a greater risk of getting burned  Raise (elevate) the affected area above the level of your heart while you are sitting or lying down.  Wrap the affected area with an elastic bandage, if told by your health care provider. The bandage applies pressure (compression) to the area, which may help to reduce swelling and help the hematoma heal. Make sure the bandage is not wrapped too tight.  If your hematoma is on a leg or foot (lower extremity) and is painful, your health care provider may recommend crutches. Use them as told by your health care provider. General instructions  Take over-the-counter and prescription medicines only as told by your health care provider.  Keep all follow-up visits as told by your health care provider. This is important. Contact a health care provider if:  You have a fever.  The swelling or discoloration gets worse.  You develop more hematomas. Get help right away if:  Your pain is worse or your pain is not controlled with medicine.  Your skin over the hematoma breaks or starts bleeding.  Your hematoma is in your chest or abdomen and you have weakness, shortness of breath, or a change in consciousness.  You have a hematoma on your scalp caused by a fall or injury and you have a headache that gets worse, trouble speaking or understanding, weakness, or a change in alertness or consciousness. Summary  A hematoma is a collection of blood under the skin, in an organ, in a body space, in a joint space, or in other tissue.  This condition usually does not need treatment because many hematomas go away on their own over time.  Large hematomas, or those that may affect vital organs, may need surgical drainage or monitoring. If the hematoma is caused by a medical condition, medicines may be  prescribed. This information is not intended to replace advice given to you by your health care provider. Make sure you discuss any questions you have with your health care provider. Document Released: 06/11/2004 Document Revised: 11/30/2016 Document Reviewed: 11/30/2016 Elsevier Interactive Patient Education  2018 [employer].

## 2018-08-19 NOTE — Progress Notes (Signed)
   10/9/20195:32 PM  Cole Klugh September 22, 1981, 37 y.o. female [id number]  Chief Complaint  Patient presents with  . Fall    fell while running, has bruise on right leg, using ice. Having muscle cramps and tylenol to cope with pain    HPI:   Patient is a 37 y.o. female who presents today for bruise on right leg  2 weeks ago she feel while running She was able to walk after fall, then 2 hours later started having sign pain Went to er - neg xrays Had been elevating, icing, wearing compression wrap Had been having severe muscle cramps 2 days ago she stopped all supportive measures. She then started noticing new bruising behind knee and up her thigh She states that overall the swelling is much better    Fall Risk  08/19/2018  Falls in the past year? Yes  Number falls in past yr: 1  Injury with Fall? Yes     Depression screen PHQ 2/9 08/17/2018  Decreased Interest 0  Down, Depressed, Hopeless 0  PHQ - 2 Score 0  Altered sleeping 0  Tired, decreased energy 0  Change in appetite 0  Feeling bad or failure about yourself  0  Trouble concentrating 0  Moving slowly or fidgety/restless 0  Suicidal thoughts 0  PHQ-9 Score 0    Allergies  Allergen Reactions  . Percocet [Oxycodone-Acetaminophen] Nausea And Vomiting    Prior to Admission medications   Medication Sig Start Date End Date Taking? Authorizing Provider    Past Medical History:  Diagnosis Date  . Kidney stones   . Kidney stones   . Migraine     Past Surgical History:  Procedure Laterality Date  . NO PAST SURGERIES      Social History   Tobacco Use  . Smoking status: Never Smoker  . Smokeless tobacco: Never Used  Substance Use Topics  . Alcohol use: Never    Frequency: Never    History reviewed. No pertinent family history.  Review of Systems  Constitutional: Negative for chills and fever.  Respiratory: Negative for cough and shortness of breath.   Cardiovascular: Negative for chest pain,  palpitations and leg swelling.  Gastrointestinal: Negative for abdominal pain, nausea and vomiting.   Per hpi  OBJECTIVE:  Blood pressure 140/87, pulse 82, temperature 98.2 F (36.8 C), temperature source Oral, height 5\' 7"  (1.702 m), weight 248 lb 6.4 oz (112.7 kg), last menstrual period 08/04/2018, SpO2 100 %. Body mass index is 38.9 kg/m.   Physical Exam  Gen: AAOx3, NAD Right leg: large mostly yellowish ecchymosis from ankle to knee along anterior aspect, with purpulish hematomas along medial foot, posterior knee and medial distal  Thigh, no discrete area of induration, no redness, warmth, no tenderness Right leg - 51.5 cm, neg homans Left leg - 45 cm   ASSESSMENT and PLAN  1. Traumatic hematoma of left lower leg, initial encounter Discussed needs to restart with supportive measures, compression wraps, elevation, warm compresses - [place] RT LOWER EXTREM LTD SOFT TISSUE NON VASCULAR; Future  2. Localized swelling of right lower leg - [place] RT LOWER EXTREM LTD SOFT TISSUE NON VASCULAR; Future  Other orders - ibuprofen (ADVIL,MOTRIN) 600 MG tablet; Take 1 tablet (600 mg total) by mouth every 8 (eight) hours as needed.  Return if symptoms worsen or fail to improve.    Rutherford Guys, MD [facility] [place], [place] [address] Ph.  [phone] Fax [phone]

## 2018-08-21 ENCOUNTER — Telehealth: Payer: Self-pay | Admitting: General Practice

## 2018-08-21 NOTE — Telephone Encounter (Signed)
Copied from [place] [id number]. Topic: General - Other >> Aug 21, 2018  1:33 PM Keene Breath wrote: Reason for CRM: Patient's mother called to get the status of a referral for an ultrasound that was requested from Dr. Pamella Pert.  Mother stated that patient is in pain and really needs to be scheduled.  Please advise.  CB# for mother is [phone].

## 2018-08-21 NOTE — Telephone Encounter (Signed)
LVM FOR PATIENT TO LET HER KNOW REFERRAL WAS SENT 10/09 TO [place] 10/11

## 2018-08-26 ENCOUNTER — Ambulatory Visit (HOSPITAL_COMMUNITY)
Admission: RE | Admit: 2018-08-26 | Discharge: 2018-08-26 | Disposition: A | Discharge status: Home / Self Care | Payer: 59 | Source: Ambulatory Visit | Attending: Family Medicine | Admitting: Family Medicine

## 2018-08-26 DIAGNOSIS — R2241 Localized swelling, mass and lump, right lower limb: Secondary | ICD-10-CM | POA: Insufficient documentation

## 2018-08-26 DIAGNOSIS — S8012XA Contusion of left lower leg, initial encounter: Secondary | ICD-10-CM | POA: Insufficient documentation

## 2018-08-26 DIAGNOSIS — X58XXXA Exposure to other specified factors, initial encounter: Secondary | ICD-10-CM | POA: Diagnosis not present

## 2018-08-26 DIAGNOSIS — S8991XA Unspecified injury of right lower leg, initial encounter: Secondary | ICD-10-CM | POA: Diagnosis not present

## 2018-08-28 ENCOUNTER — Telehealth: Payer: Self-pay | Admitting: Family Medicine

## 2018-08-28 NOTE — Telephone Encounter (Signed)
Copied from [place] [id number]. Topic: Quick Communication - See Telephone Encounter >> Aug 28, 2018  3:04 PM [name] wrote: CRM for notification. See Telephone encounter for: 08/28/18.  Patient is calling to request the results from her ultrasound 08/27/2018  Please advise

## 2018-08-30 ENCOUNTER — Encounter: Payer: Self-pay | Admitting: Family Medicine

## 2018-08-30 NOTE — Telephone Encounter (Signed)
Pt message sent to Dr. Pamella Pert  Pt req results of U/S

## 2018-08-31 NOTE — Telephone Encounter (Signed)
Sent message via mychart

## 2018-09-01 NOTE — Telephone Encounter (Signed)
Dr. Pamella Pert sent results via MyChart to pt

## 2019-05-09 ENCOUNTER — Encounter (HOSPITAL_BASED_OUTPATIENT_CLINIC_OR_DEPARTMENT_OTHER): Payer: Self-pay | Admitting: Emergency Medicine

## 2019-05-09 ENCOUNTER — Other Ambulatory Visit: Payer: Self-pay

## 2019-05-09 ENCOUNTER — Emergency Department (HOSPITAL_BASED_OUTPATIENT_CLINIC_OR_DEPARTMENT_OTHER)
Admission: EM | Admit: 2019-05-09 | Discharge: 2019-05-09 | Disposition: A | Discharge status: Home / Self Care | Payer: 59 | Attending: Emergency Medicine | Admitting: Emergency Medicine

## 2019-05-09 ENCOUNTER — Emergency Department (HOSPITAL_BASED_OUTPATIENT_CLINIC_OR_DEPARTMENT_OTHER): Payer: 59

## 2019-05-09 DIAGNOSIS — Y92410 Unspecified street and highway as the place of occurrence of the external cause: Secondary | ICD-10-CM | POA: Diagnosis not present

## 2019-05-09 DIAGNOSIS — Z23 Encounter for immunization: Secondary | ICD-10-CM | POA: Insufficient documentation

## 2019-05-09 DIAGNOSIS — Y9355 Activity, bike riding: Secondary | ICD-10-CM | POA: Insufficient documentation

## 2019-05-09 DIAGNOSIS — S52101A Unspecified fracture of upper end of right radius, initial encounter for closed fracture: Secondary | ICD-10-CM | POA: Insufficient documentation

## 2019-05-09 DIAGNOSIS — S59911A Unspecified injury of right forearm, initial encounter: Secondary | ICD-10-CM | POA: Diagnosis present

## 2019-05-09 DIAGNOSIS — S01111A Laceration without foreign body of right eyelid and periocular area, initial encounter: Secondary | ICD-10-CM | POA: Insufficient documentation

## 2019-05-09 DIAGNOSIS — Y998 Other external cause status: Secondary | ICD-10-CM | POA: Diagnosis not present

## 2019-05-09 DIAGNOSIS — S0181XA Laceration without foreign body of other part of head, initial encounter: Secondary | ICD-10-CM

## 2019-05-09 DIAGNOSIS — S52109A Unspecified fracture of upper end of unspecified radius, initial encounter for closed fracture: Secondary | ICD-10-CM

## 2019-05-09 MED ORDER — TETANUS-DIPHTH-ACELL PERTUSSIS 5-2.5-18.5 LF-MCG/0.5 IM SUSP
0.5000 mL | Freq: Once | INTRAMUSCULAR | Status: AC
  Administered 2019-05-09: 17:00:00 0.5 mL via INTRAMUSCULAR
  Filled 2019-05-09: qty 0.5

## 2019-05-09 NOTE — Discharge Instructions (Signed)
No lifting with the right arm but be sure to take it out of the sling several times a day and stretch and range it to avoid getting stiff.  Take Tylenol and ibuprofen as needed for pain.

## 2019-05-09 NOTE — ED Notes (Signed)
Patient transported to X-ray 
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
## 2019-06-22 ENCOUNTER — Other Ambulatory Visit: Payer: Self-pay

## 2019-06-22 ENCOUNTER — Encounter (HOSPITAL_BASED_OUTPATIENT_CLINIC_OR_DEPARTMENT_OTHER): Payer: Self-pay | Admitting: *Deleted

## 2019-06-22 ENCOUNTER — Emergency Department (HOSPITAL_BASED_OUTPATIENT_CLINIC_OR_DEPARTMENT_OTHER): Payer: 59

## 2019-06-22 ENCOUNTER — Emergency Department (HOSPITAL_BASED_OUTPATIENT_CLINIC_OR_DEPARTMENT_OTHER)
Admission: EM | Admit: 2019-06-22 | Discharge: 2019-06-23 | Disposition: A | Discharge status: Home / Self Care | Payer: 59 | Attending: Emergency Medicine | Admitting: Emergency Medicine

## 2019-06-22 DIAGNOSIS — H5702 Anisocoria: Secondary | ICD-10-CM

## 2019-06-22 LAB — CBC WITH DIFFERENTIAL/PLATELET
Abs Immature Granulocytes: 0.06 10*3/uL (ref 0.00–0.07)
Basophils Absolute: 0.1 10*3/uL (ref 0.0–0.1)
Basophils Relative: 1 %
Eosinophils Absolute: 0.3 10*3/uL (ref 0.0–0.5)
Eosinophils Relative: 2 %
HCT: 40.9 % (ref 36.0–46.0)
Hemoglobin: 13.2 g/dL (ref 12.0–15.0)
Immature Granulocytes: 0 %
Lymphocytes Relative: 25 %
Lymphs Abs: 3.8 10*3/uL (ref 0.7–4.0)
MCH: 28 pg (ref 26.0–34.0)
MCHC: 32.3 g/dL (ref 30.0–36.0)
MCV: 86.8 fL (ref 80.0–100.0)
Monocytes Absolute: 1.4 10*3/uL — ABNORMAL HIGH (ref 0.1–1.0)
Monocytes Relative: 9 %
Neutro Abs: 9.8 10*3/uL — ABNORMAL HIGH (ref 1.7–7.7)
Neutrophils Relative %: 63 %
Platelets: 411 10*3/uL — ABNORMAL HIGH (ref 150–400)
RBC: 4.71 MIL/uL (ref 3.87–5.11)
RDW: 14.6 % (ref 11.5–15.5)
WBC: 15.4 10*3/uL — ABNORMAL HIGH (ref 4.0–10.5)
nRBC: 0 % (ref 0.0–0.2)

## 2019-06-22 LAB — BASIC METABOLIC PANEL
Anion gap: 11 (ref 5–15)
BUN: 17 mg/dL (ref 6–20)
CO2: 22 mmol/L (ref 22–32)
Calcium: 9.6 mg/dL (ref 8.9–10.3)
Chloride: 104 mmol/L (ref 98–111)
Creatinine, Ser: 0.61 mg/dL (ref 0.44–1.00)
GFR calc Af Amer: >60 mL/min (ref >60)
GFR calc non Af Amer: >60 mL/min (ref >60)
Glucose, Bld: 96 mg/dL (ref 70–99)
Potassium: 3.8 mmol/L (ref 3.5–5.1)
Sodium: 137 mmol/L (ref 135–145)

## 2019-06-22 LAB — PREGNANCY, URINE: Preg Test, Ur: NEGATIVE

## 2019-06-22 LAB — CBG MONITORING, ED: Glucose-Capillary: 88 mg/dL (ref 70–99)
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
## 2019-06-22 NOTE — ED Notes (Signed)
ED Provider at bedside. 
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
## 2019-06-22 NOTE — ED Notes (Signed)
Patient transported to CT 

## 2019-06-23 NOTE — Discharge Instructions (Addendum)
Please follow-up with ophthalmology if your symptoms continue.  Symptoms of unequal pupils often resolve on their own.  Symptoms of unequal pupils are seen and approximately 20% of the normal population at any given time.  If you develop symptoms of double vision, inability to move your eyes normally, drooping of your eyelid, numbness or weakness, please return to the emergency department.

## 2019-09-13 ENCOUNTER — Other Ambulatory Visit: Payer: Self-pay

## 2019-09-13 DIAGNOSIS — Z20822 Contact with and (suspected) exposure to covid-19: Secondary | ICD-10-CM

## 2019-09-15 ENCOUNTER — Encounter: Payer: Self-pay | Admitting: *Deleted

## 2019-09-15 LAB — NOVEL CORONAVIRUS, NAA: SARS-CoV-2, NAA: DETECTED — AB

## 2021-12-06 ENCOUNTER — Ambulatory Visit: Payer: Self-pay

## 2022-03-18 ENCOUNTER — Ambulatory Visit (INDEPENDENT_AMBULATORY_CARE_PROVIDER_SITE_OTHER): Payer: 59

## 2022-03-18 ENCOUNTER — Ambulatory Visit
Admission: EM | Admit: 2022-03-18 | Discharge: 2022-03-18 | Disposition: A | Discharge status: Home / Self Care | Payer: 59 | Attending: Physician Assistant | Admitting: Physician Assistant

## 2022-03-18 DIAGNOSIS — M25571 Pain in right ankle and joints of right foot: Secondary | ICD-10-CM | POA: Diagnosis not present

## 2022-03-18 DIAGNOSIS — S92314A Nondisplaced fracture of first metatarsal bone, right foot, initial encounter for closed fracture: Secondary | ICD-10-CM

## 2022-03-18 NOTE — ED Provider Notes (Signed)
?[facility] ? ? ? ?CSN: [id number] ?Arrival date & time: 03/18/22  0830 ? ? ?  ? ?History   ?Chief Complaint ?Chief Complaint  ?Patient presents with  ? Ankle Pain  ? ? ?HPI ?Jamie Preston is a 41 y.o. female.  ? ?Pt twisted foot over a root while walking.  Pt complains of pain. Pt unable to tolerate walking  ? ?The history is provided by the patient. No language interpreter was used.  ?Ankle Pain ?Location:  Foot ?Time since incident:  1 day ?Injury: no   ?Foot location:  R foot ?Pain details:  ?  Quality:  Aching ?  Radiates to:  Does not radiate ?  Severity:  Moderate ?  Onset quality:  Gradual ?  Duration:  1 day ?  Timing:  Constant ?  Progression:  Worsening ?Chronicity:  New ?Dislocation: no   ?Prior injury to area:  No ?Relieved by:  Nothing ?Worsened by:  Nothing ?Ineffective treatments:  None tried ?Associated symptoms: decreased ROM   ?Risk factors: no concern for non-accidental trauma   ? ?Past Medical History:  ?Diagnosis Date  ? Kidney stones   ? Kidney stones   ? Migraine   ? ? ?There are no problems to display for this patient. ? ? ?Past Surgical History:  ?Procedure Laterality Date  ? NO PAST SURGERIES    ? ? ?OB History   ? ? Gravida  ?0  ? Para  ?0  ? Term  ?0  ? Preterm  ?0  ? AB  ?0  ? Living  ?0  ?  ? ? SAB  ?0  ? IAB  ?0  ? Ectopic  ?0  ? Multiple  ?0  ? Live Births  ?0  ?   ?  ?  ? ? ? ?Home Medications   ? ?Prior to Admission medications   ?Medication Sig Start Date End Date Taking? Authorizing Provider  ?ibuprofen (ADVIL,MOTRIN) 600 MG tablet Take 1 tablet (600 mg total) by mouth every 8 (eight) hours as needed. 08/19/18   Daleen Squibb, MD  ?traMADol (ULTRAM) 50 MG tablet Take 1 tablet (50 mg total) by mouth every 6 (six) hours as needed. ?Patient not taking: Reported on 08/19/2018 07/17/18   Gareth Morgan, MD  ? ? ?Family History ?History reviewed. No pertinent family history. ? ?Social History ?Social History  ? ?Tobacco Use  ? Smoking status: Never  ? Smokeless tobacco:  Never  ?Vaping Use  ? Vaping Use: Never used  ?Substance Use Topics  ? Alcohol use: Never  ? Drug use: Never  ? ? ? ?Allergies   ?Percocet [oxycodone-acetaminophen] ? ? ?Review of Systems ?Review of Systems  ?All other systems reviewed and are negative. ? ? ?Physical Exam ?Triage Vital Signs ?ED Triage Vitals  ?Enc Vitals Group  ?   BP 03/18/22 0856 (!) 146/93  ?   Pulse Rate 03/18/22 0856 83  ?   Resp 03/18/22 0856 18  ?   Temp 03/18/22 0856 97.7 ?F (36.5 ?C)  ?   Temp Source 03/18/22 0856 Oral  ?   SpO2 03/18/22 0856 98 %  ?   Weight --   ?   Height --   ?   Head Circumference --   ?   Peak Flow --   ?   Pain Score 03/18/22 0855 8  ?   Pain Loc --   ?   Pain Edu? --   ?   Excl. in  GC? --   ? ?No data found. ? ?Updated Vital Signs ?BP (!) 146/93   Pulse 83   Temp 97.7 ?F (36.5 ?C) (Oral)   Resp 18   LMP 02/25/2022 (Approximate)   SpO2 98%  ? ?Visual Acuity ?Right Eye Distance:   ?Left Eye Distance:   ?Bilateral Distance:   ? ?Right Eye Near:   ?Left Eye Near:    ?Bilateral Near:    ? ?Physical Exam ?Vitals reviewed.  ?Constitutional:   ?   Appearance: Normal appearance.  ?Cardiovascular:  ?   Rate and Rhythm: Normal rate.  ?Pulmonary:  ?   Effort: Pulmonary effort is normal.  ?Musculoskeletal:     ?   General: Swelling and tenderness present.  ?   Comments: Swollen right foot, tender 5th metacarpal area, pain with range of motion nv and ns inatct   ?Skin: ?   General: Skin is warm.  ?Neurological:  ?   General: No focal deficit present.  ?   Mental Status: She is alert.  ?Psychiatric:     ?   Mood and Affect: Mood normal.  ? ? ? ?UC Treatments / Results  ?Labs ?(all labs ordered are listed, but only abnormal results are displayed) ?Labs Reviewed - No data to display ? ?EKG ? ? ?Radiology ?DG Ankle Complete Right ? ?Result Date: 03/18/2022 ?CLINICAL DATA:  Right ankle injury. EXAM: RIGHT ANKLE - COMPLETE 3+ VIEW COMPARISON:  None Available. FINDINGS: There is diffuse soft tissue swelling. An acute mildly  comminuted, intra-articular fracture deformity is involving the base of the fifth metatarsal bone with 4 mm distraction of the fracture fragments. No additional fracture or dislocation. IMPRESSION: Acute, mildly comminuted, intra-articular fracture deformity involves the base of the fifth metatarsal bone. Electronically Signed   By: Kerby Moors M.D.   On: 03/18/2022 09:31  ? ?DG Foot Complete Right ? ?Result Date: 03/18/2022 ?CLINICAL DATA:  Right ankle pain EXAM: RIGHT FOOT COMPLETE - 3+ VIEW COMPARISON:  None Available. FINDINGS: Acute nondisplaced transverse fracture at the base of the fifth metatarsal with 2 mm of distraction. No aggressive osseous lesion. Normal alignment. Tiny plantar calcaneal spur. Soft tissue are unremarkable. No radiopaque foreign body or soft tissue emphysema. IMPRESSION: 1. Acute nondisplaced transverse fracture at the base of the fifth metatarsal. Electronically Signed   By: Kathreen Devoid M.D.   On: 03/18/2022 09:31   ? ?Procedures ?Procedures (including critical care time) ? ?Medications Ordered in UC ?Medications - No data to display ? ?Initial Impression / Assessment and Plan / UC Course  ?I have reviewed the triage vital signs and the nursing notes. ? ?Pertinent labs & imaging results that were available during my care of the patient were reviewed by me and considered in my medical decision making (see chart for details). ? ?  ? ? ?Final Clinical Impressions(s) / UC Diagnoses  ? ?Final diagnoses:  ?Closed nondisplaced fracture of first metatarsal bone of right foot, initial encounter  ? ? ? ?Discharge Instructions   ? ?  ?Return if any problems.  Schedule to see the Orthopaedist for evaluation  ? ? ?ED Prescriptions   ?None ?  ? ?PDMP not reviewed this encounter. ?An After Visit Summary was printed and given to the patient. ? ?  ?Fransico Meadow, [place] ?03/18/22 3016 ? ?

## 2022-03-18 NOTE — ED Triage Notes (Signed)
Patient presents to Urgent Care with complaints of fall yesterday with ankle injury when hiking. Pt then had to hike back to car. Patient reports otc medications, ace wrap, and crutches.  ?Pt reports 6/10 pain with pressure ?

## 2022-03-18 NOTE — Discharge Instructions (Addendum)
Return if any problems.  Schedule to see the Orthopaedist for evaluation °

## 2022-10-16 ENCOUNTER — Ambulatory Visit (HOSPITAL_COMMUNITY): Payer: Self-pay

## 2022-10-19 ENCOUNTER — Ambulatory Visit
Admission: RE | Admit: 2022-10-19 | Discharge: 2022-10-19 | Disposition: A | Discharge status: Home / Self Care | Payer: 59 | Source: Ambulatory Visit

## 2022-10-19 VITALS — BP 148/103 | HR 69 | Temp 98.6°F | Resp 18

## 2022-10-19 DIAGNOSIS — L989 Disorder of the skin and subcutaneous tissue, unspecified: Secondary | ICD-10-CM

## 2022-10-19 NOTE — ED Provider Notes (Signed)
[facility]    CSN: [id number] Arrival date & time: 10/19/22  1231      History   Chief Complaint Chief Complaint  Patient presents with   APPT: Abrasion on Left Leg    HPI Jamie Preston is a 41 y.o. female.   Patient here today for evaluation of skin lesion to her left anterior lower leg that is been present for several months.  She reports that lesion does not appear to be healing which is concerning.  She does not currently have a dermatologist.  She does not report any other symptoms.  The history is provided by the patient.    Past Medical History:  Diagnosis Date   Kidney stones    Kidney stones    Migraine     There are no problems to display for this patient.   Past Surgical History:  Procedure Laterality Date   NO PAST SURGERIES      OB History     Gravida  0   Para  0   Term  0   Preterm  0   AB  0   Living  0      SAB  0   IAB  0   Ectopic  0   Multiple  0   Live Births  0            Home Medications    Prior to Admission medications   Medication Sig Start Date End Date Taking? Authorizing Provider  ibuprofen (ADVIL,MOTRIN) 600 MG tablet Take 1 tablet (600 mg total) by mouth every 8 (eight) hours as needed. 08/19/18   Daleen Squibb, MD  traMADol (ULTRAM) 50 MG tablet Take 1 tablet (50 mg total) by mouth every 6 (six) hours as needed. Patient not taking: Reported on 08/19/2018 07/17/18   Gareth Morgan, MD    Family History History reviewed. No pertinent family history.  Social History Social History   Tobacco Use   Smoking status: Never   Smokeless tobacco: Never  Vaping Use   Vaping Use: Never used  Substance Use Topics   Alcohol use: Never   Drug use: Never     Allergies   Percocet [oxycodone-acetaminophen]   Review of Systems Review of Systems  Constitutional:  Negative for chills and fever.  Eyes:  Negative for discharge and redness.  Gastrointestinal:  Negative for abdominal pain,  nausea and vomiting.  Skin:  Negative for color change and wound.     Physical Exam Triage Vital Signs ED Triage Vitals  Enc Vitals Group     BP      Pulse      Resp      Temp      Temp src      SpO2      Weight      Height      Head Circumference      Peak Flow      Pain Score      Pain Loc      Pain Edu?      Excl. in [place]?    No data found.  Updated Vital Signs BP (!) 148/103 (BP Location: Left Arm)   Pulse 69   Temp 98.6 F (37 C) (Oral)   Resp 18   LMP 10/16/2022   SpO2 98%   Visual Acuity Right Eye Distance:   Left Eye Distance:   Bilateral Distance:    Right Eye Near:   Left Eye Near:  Bilateral Near:     Physical Exam Vitals and nursing note reviewed.  Constitutional:      General: She is not in acute distress.    Appearance: Normal appearance. She is not ill-appearing.  HENT:     Head: Normocephalic and atraumatic.  Eyes:     Conjunctiva/sclera: Conjunctivae normal.  Cardiovascular:     Rate and Rhythm: Normal rate.  Pulmonary:     Effort: Pulmonary effort is normal.  Musculoskeletal:     Comments: Approx 1.5 cm scaling lesion to left anterior lower leg with minimal erythema, no bleeding or drainage.  Neurological:     Mental Status: She is alert.  Psychiatric:        Mood and Affect: Mood normal.        Behavior: Behavior normal.        Thought Content: Thought content normal.      UC Treatments / Results  Labs (all labs ordered are listed, but only abnormal results are displayed) Labs Reviewed - No data to display  EKG   Radiology No results found.  Procedures Procedures (including critical care time)  Medications Ordered in UC Medications - No data to display  Initial Impression / Assessment and Plan / UC Course  I have reviewed the triage vital signs and the nursing notes.  Pertinent labs & imaging results that were available during my care of the patient were reviewed by me and considered in my medical decision  making (see chart for details).    Recommend further evaluation by dermatology for biopsy. Patient is agreeable. Contact information provided for same.   Final Clinical Impressions(s) / UC Diagnoses   Final diagnoses:  Skin lesion of left leg   Discharge Instructions   None    ED Prescriptions   None    PDMP not reviewed this encounter.   Francene Finders, PA-C 10/19/22 1413

## 2022-10-19 NOTE — ED Triage Notes (Signed)
Pt presents with small lesion on left leg for past few months.

## 2023-09-01 ENCOUNTER — Ambulatory Visit
Admission: RE | Admit: 2023-09-01 | Discharge: 2023-09-01 | Disposition: A | Discharge status: Home / Self Care | Payer: 59 | Source: Ambulatory Visit | Attending: Family Medicine | Admitting: Family Medicine

## 2023-09-01 VITALS — BP 164/99 | HR 86 | Temp 98.3°F | Resp 16

## 2023-09-01 DIAGNOSIS — H109 Unspecified conjunctivitis: Secondary | ICD-10-CM

## 2023-09-01 MED ORDER — MOXIFLOXACIN HCL 0.5 % OP SOLN: 1.0000 [drp] | Freq: Three times a day (TID) | OPHTHALMIC | 0 refills | Status: DC

## 2023-09-01 NOTE — ED Triage Notes (Signed)
Woke up with discharge on right eye this morning. Reports both have been itching over the last couple days. Was exposed to a child with pink eye over the weekend. Denies any changes to vision, denies foreign body involvement. Does wear contacts regularly, last worn yesterday.

## 2023-09-01 NOTE — Discharge Instructions (Signed)
You were seen today for likely bacterial conjunctivitis.  I have sent out an antibiotic eye drop to use.  You may use this in both eyes if needed.  Wash your hands frequently.  Avoid wearing your contacts while using the eye drops.  Please return if not improving or worsening.

## 2023-09-01 NOTE — ED Provider Notes (Signed)
[facility]    CSN: [id number] Arrival date & time: 09/01/23  1218      History   Chief Complaint Chief Complaint  Patient presents with   Conjunctivitis    Entered by patient    HPI Jamie Preston is a 42 y.o. female.    Conjunctivitis  Woke up this morning and had a "jelly like" substance in the right eye.  She has had bilateral eye itchiness x several weeks. Thought was allergies.  This was new this morning.  No pain.  Exposed to someone with pink eye recently.  She does wear contacts, wearing glasses today.        Past Medical History:  Diagnosis Date   Kidney stones    Kidney stones    Migraine     There are no problems to display for this patient.   Past Surgical History:  Procedure Laterality Date   NO PAST SURGERIES      OB History     Gravida  0   Para  0   Term  0   Preterm  0   AB  0   Living  0      SAB  0   IAB  0   Ectopic  0   Multiple  0   Live Births  0            Home Medications    Prior to Admission medications   Medication Sig Start Date End Date Taking? Authorizing Provider  ibuprofen (ADVIL,MOTRIN) 600 MG tablet Take 1 tablet (600 mg total) by mouth every 8 (eight) hours as needed. 08/19/18   Noni Saupe, MD  traMADol (ULTRAM) 50 MG tablet Take 1 tablet (50 mg total) by mouth every 6 (six) hours as needed. Patient not taking: Reported on 08/19/2018 07/17/18   Alvira Monday, MD    Family History History reviewed. No pertinent family history.  Social History Social History   Tobacco Use   Smoking status: Never   Smokeless tobacco: Never  Vaping Use   Vaping status: Never Used  Substance Use Topics   Alcohol use: Never   Drug use: Never     Allergies   Percocet [oxycodone-acetaminophen]   Review of Systems Review of Systems  Constitutional: Negative.   HENT: Negative.    Eyes:  Positive for discharge and redness.  Respiratory: Negative.    Cardiovascular:  Negative.   Gastrointestinal: Negative.   Musculoskeletal: Negative.      Physical Exam Triage Vital Signs ED Triage Vitals [09/01/23 1233]  Encounter Vitals Group     BP (!) 164/99     Systolic BP Percentile      Diastolic BP Percentile      Pulse Rate 86     Resp 16     Temp 98.3 F (36.8 C)     Temp Source Oral     SpO2 99 %     Weight      Height      Head Circumference      Peak Flow      Pain Score 0     Pain Loc      Pain Education      Exclude from Growth Chart    No data found.  Updated Vital Signs BP (!) 164/99 (BP Location: Left Arm)   Pulse 86   Temp 98.3 F (36.8 C) (Oral)   Resp 16   SpO2 99%   Visual Acuity Right Eye  Distance:   Left Eye Distance:   Bilateral Distance:    Right Eye Near:   Left Eye Near:    Bilateral Near:     Physical Exam Constitutional:      Appearance: Normal appearance.  Eyes:     General: Lids are normal.     Conjunctiva/sclera:     Right eye: Right conjunctiva is injected. Exudate present.  Cardiovascular:     Rate and Rhythm: Normal rate and regular rhythm.  Pulmonary:     Effort: Pulmonary effort is normal.     Breath sounds: Normal breath sounds.  Musculoskeletal:     Cervical back: Normal range of motion.  Neurological:     General: No focal deficit present.     Mental Status: She is alert.  Psychiatric:        Mood and Affect: Mood normal.      UC Treatments / Results  Labs (all labs ordered are listed, but only abnormal results are displayed) Labs Reviewed - No data to display  EKG   Radiology No results found.  Procedures Procedures (including critical care time)  Medications Ordered in UC Medications - No data to display  Initial Impression / Assessment and Plan / UC Course  I have reviewed the triage vital signs and the nursing notes.  Pertinent labs & imaging results that were available during my care of the patient were reviewed by me and considered in my medical decision making  (see chart for details).  Final Clinical Impressions(s) / UC Diagnoses   Final diagnoses:  Bacterial conjunctivitis     Discharge Instructions      You were seen today for likely bacterial conjunctivitis.  I have sent out an antibiotic eye drop to use.  You may use this in both eyes if needed.  Wash your hands frequently.  Avoid wearing your contacts while using the eye drops.  Please return if not improving or worsening.     ED Prescriptions     Medication Sig Dispense Auth. Provider   moxifloxacin (VIGAMOX) 0.5 % ophthalmic solution Place 1 drop into both eyes 3 (three) times daily. 3 mL Jannifer Franklin, MD      PDMP not reviewed this encounter.   Jannifer Franklin, MD 09/01/23 1255

## 2024-01-11 IMAGING — DX DG FOOT COMPLETE 3+V*R*
3 series · 3 of 3 positions shown · non-contrast
Comparison: None Available.

CLINICAL DATA: Right ankle pain

EXAM:
RIGHT FOOT COMPLETE - 3+ VIEW

[foot supine dp]
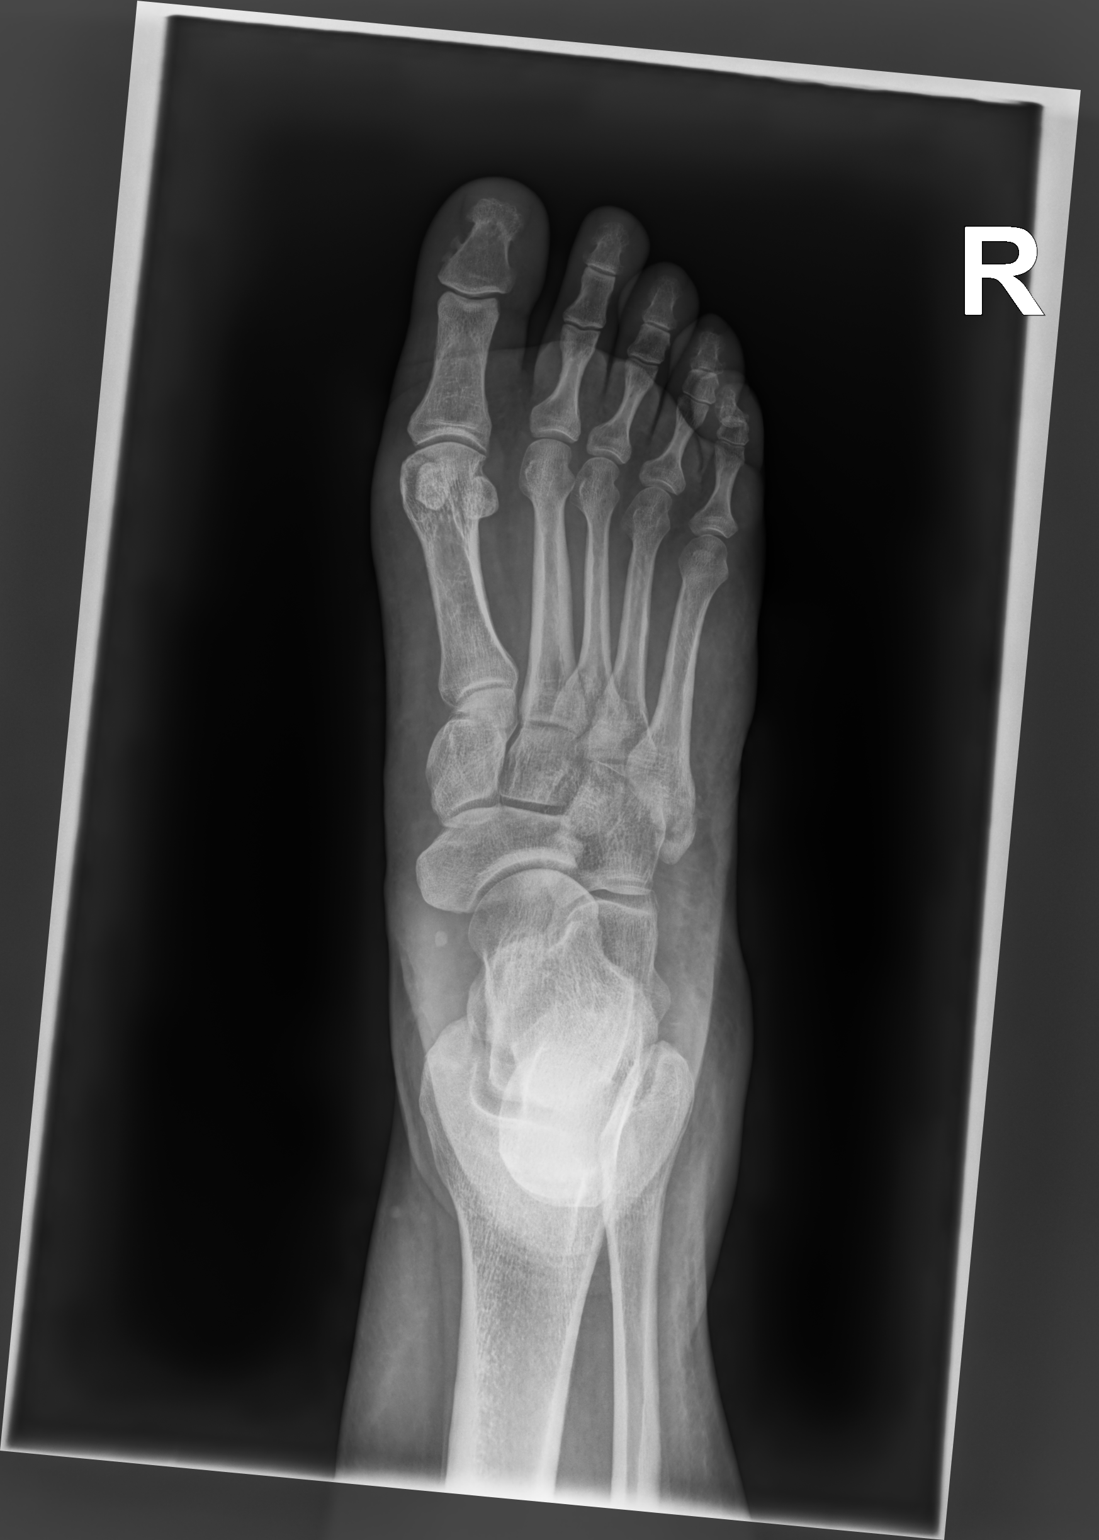

[foot medial oblique]
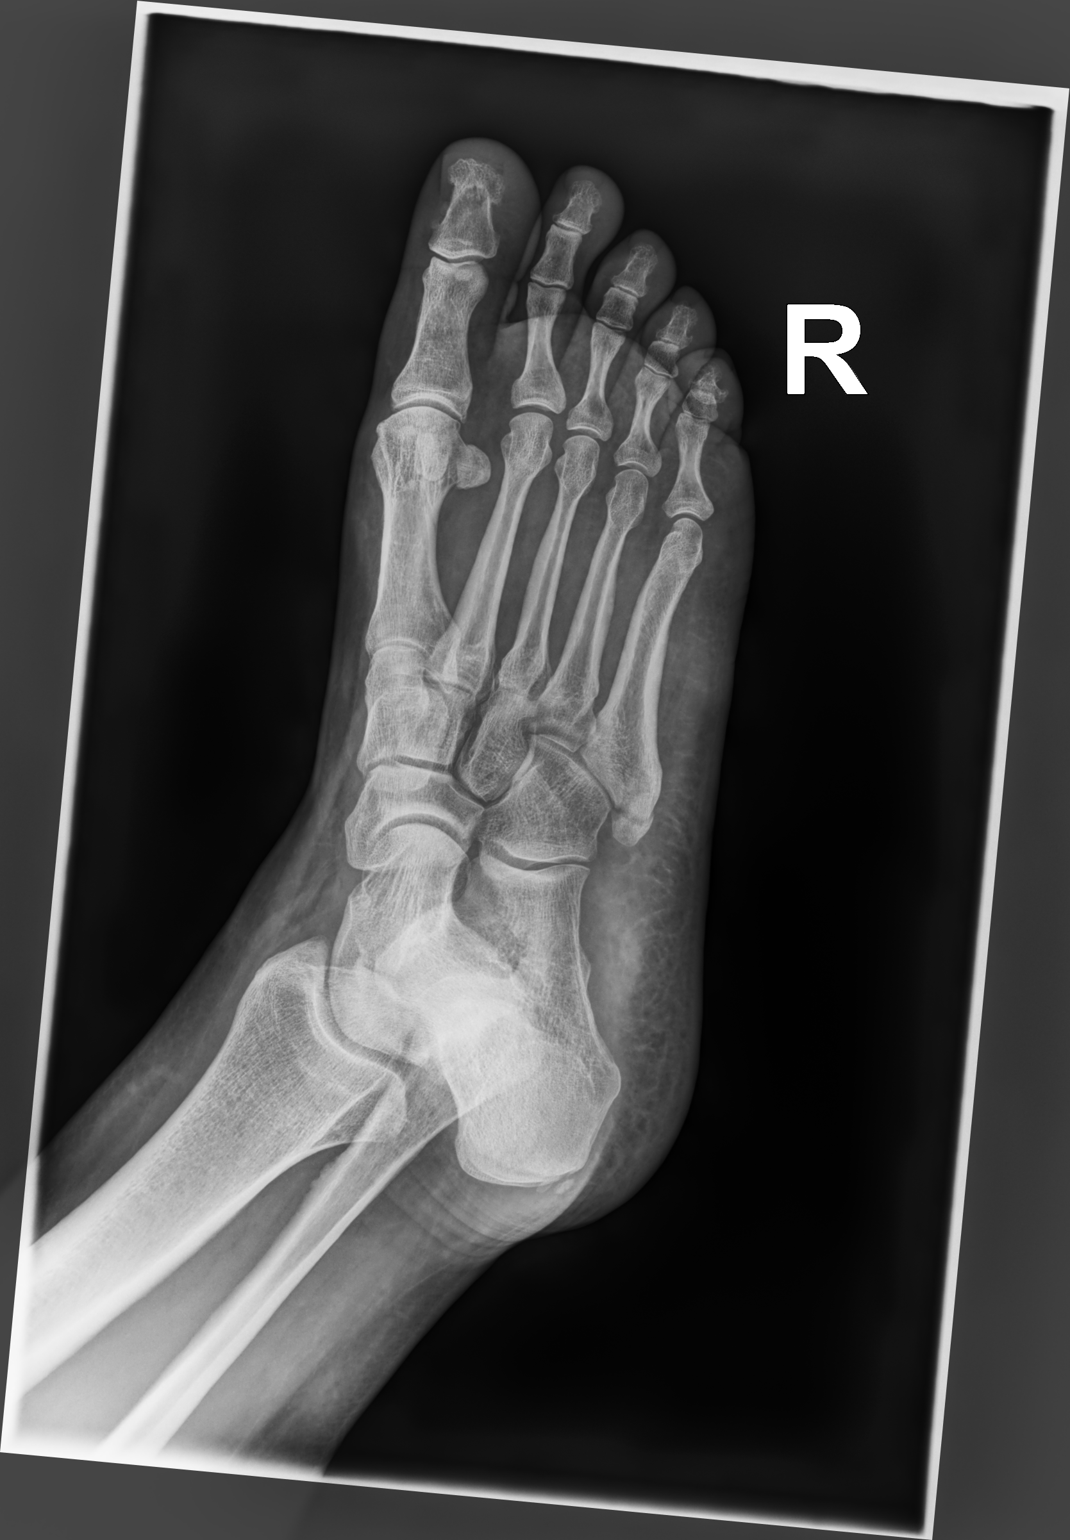

[foot supine lat]
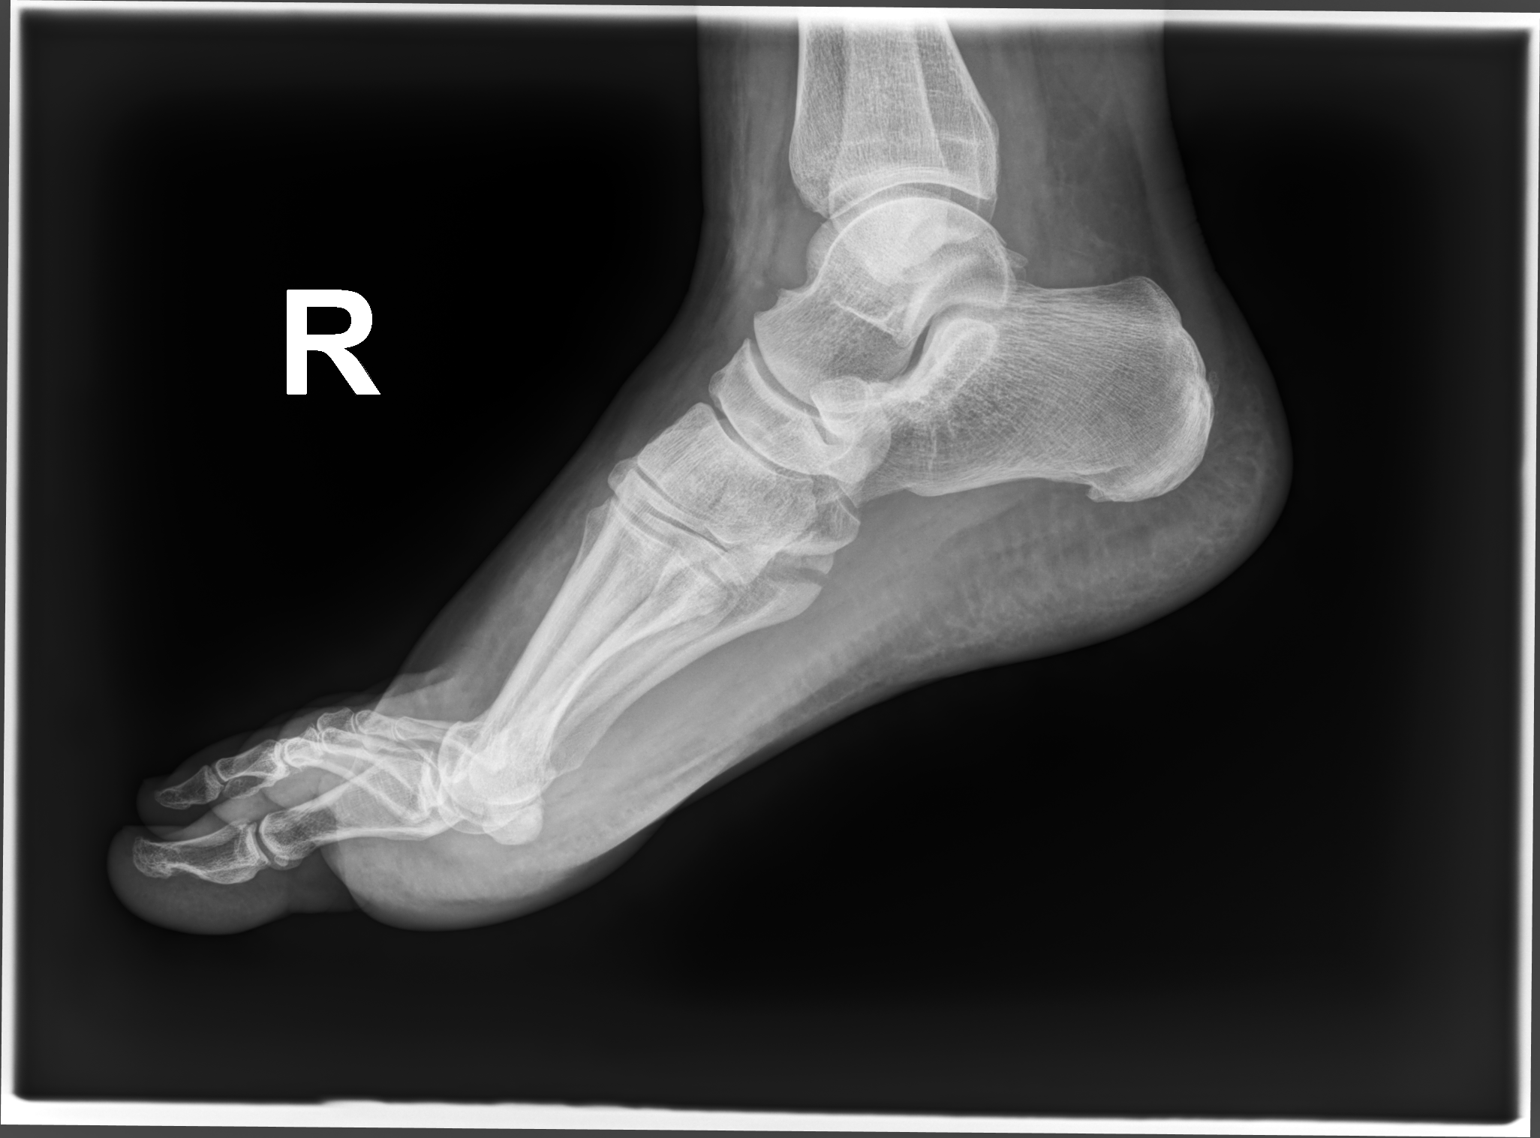

[3 of 3 positions shown; findings below may reference images not displayed]

FINDINGS: Acute nondisplaced transverse fracture at the base of the fifth
metatarsal with 2 mm of distraction. No aggressive osseous lesion.
Normal alignment. Tiny plantar calcaneal spur.

Soft tissue are unremarkable. No radiopaque foreign body or soft
tissue emphysema.
IMPRESSION: 1. Acute nondisplaced transverse fracture at the base of the fifth
metatarsal.

## 2024-01-11 IMAGING — DX DG ANKLE COMPLETE 3+V*R*
4 series · 4 of 4 positions shown · non-contrast
Comparison: None Available.

CLINICAL DATA: Right ankle injury.

EXAM:
RIGHT ANKLE - COMPLETE 3+ VIEW

[ankle ap]
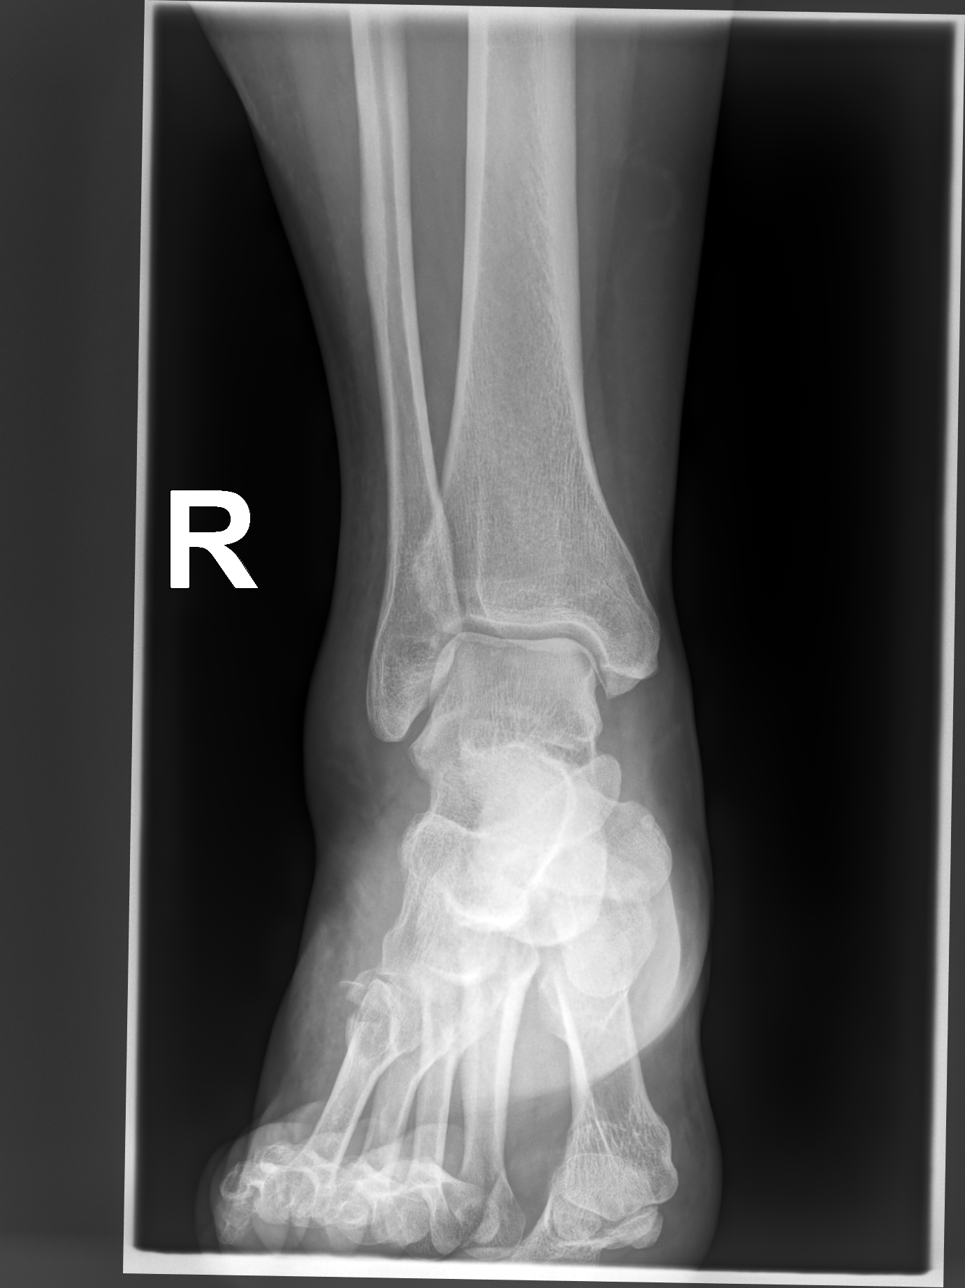

[ankle medial oblique]
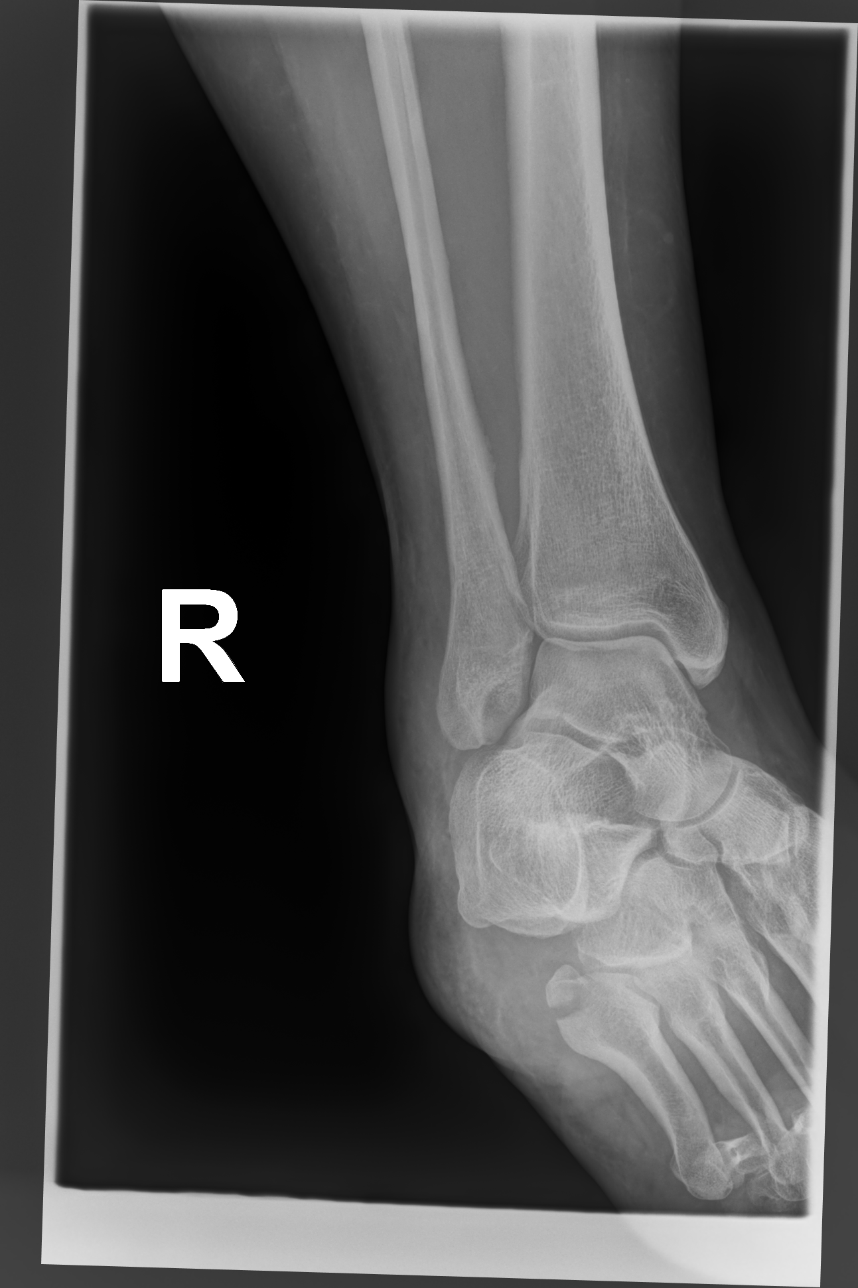

[ankle lat (1 of 2)]
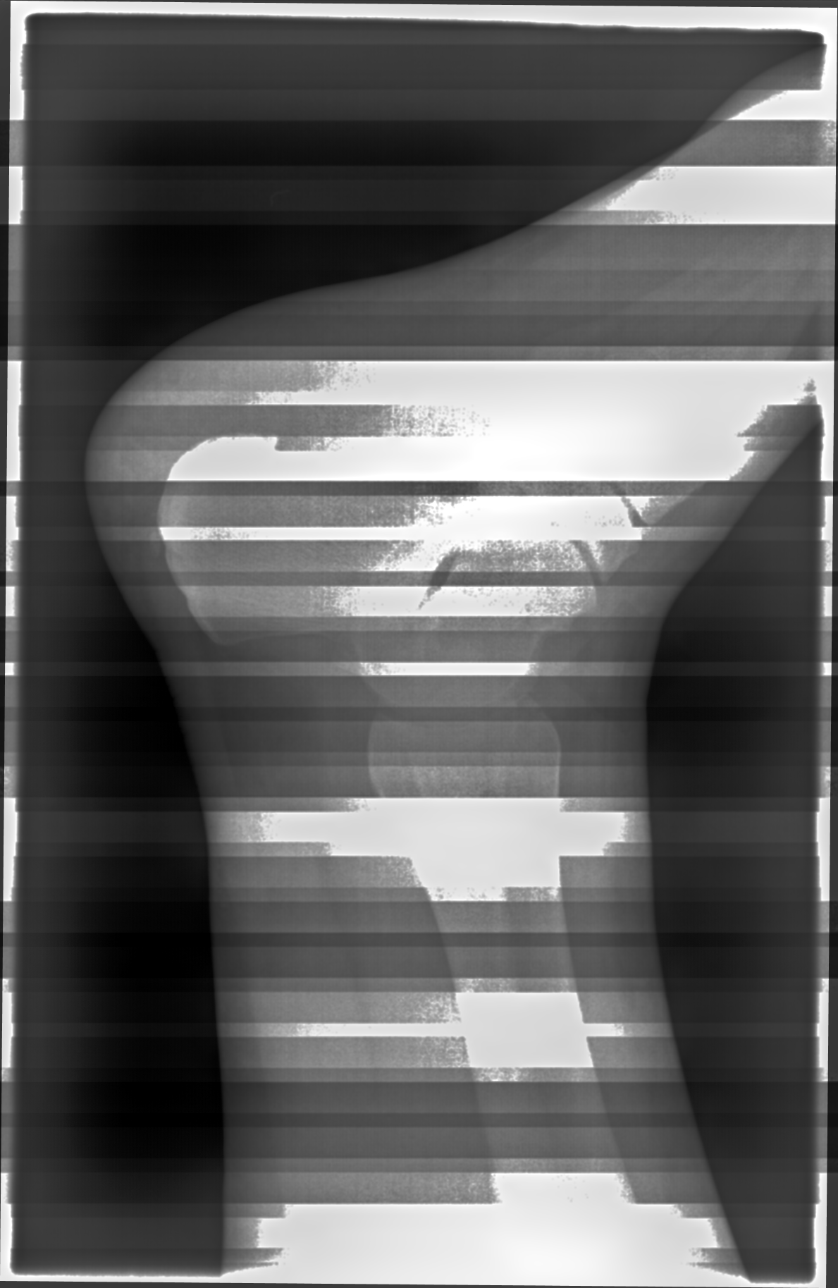

[ankle lat (2 of 2)]
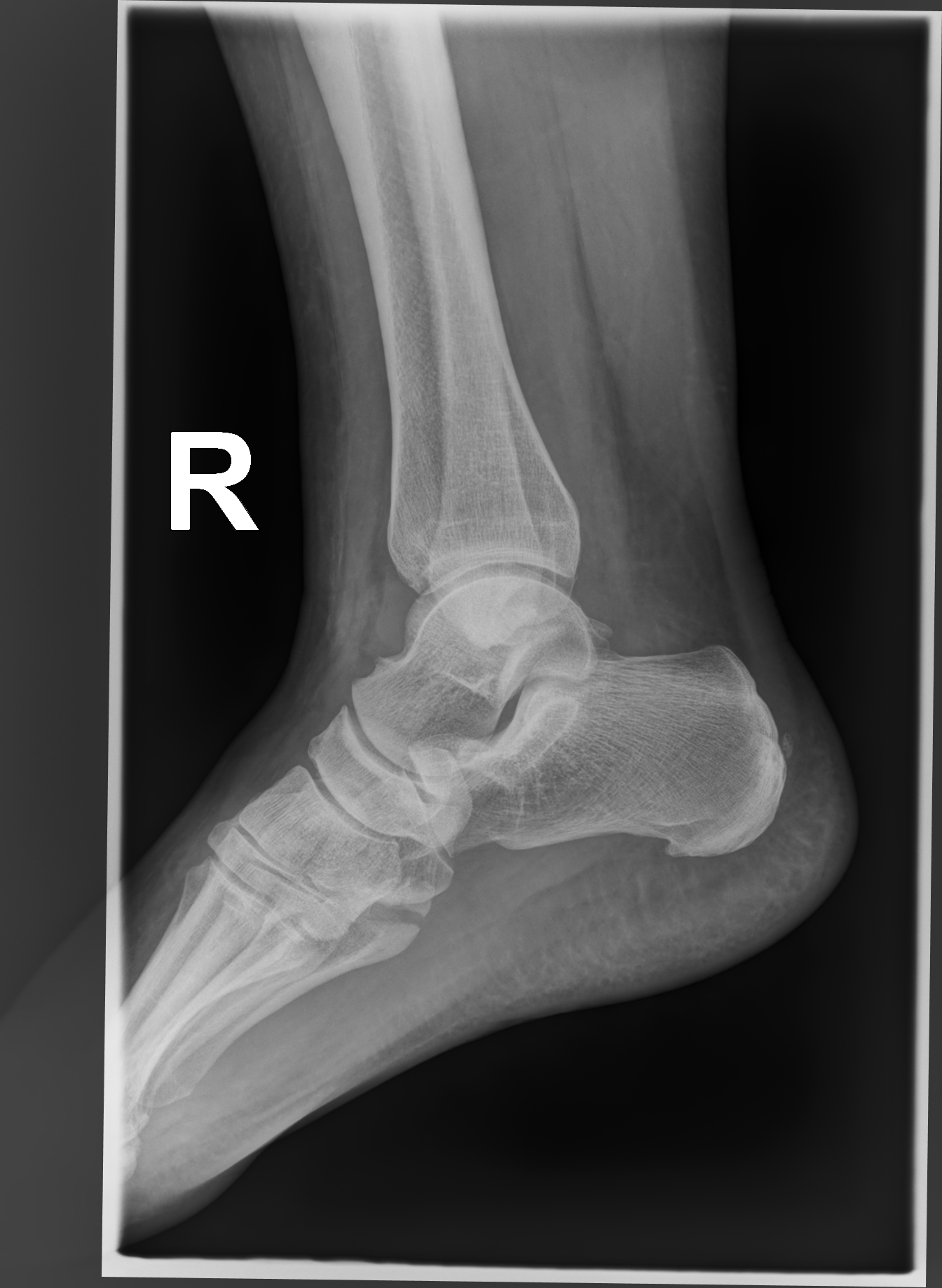

[4 of 4 positions shown; findings below may reference images not displayed]

FINDINGS: There is diffuse soft tissue swelling. An acute mildly comminuted,
intra-articular fracture deformity is involving the base of the
fifth metatarsal bone with 4 mm distraction of the fracture
fragments. No additional fracture or dislocation.
IMPRESSION: Acute, mildly comminuted, intra-articular fracture deformity
involves the base of the fifth metatarsal bone.

## 2024-02-24 ENCOUNTER — Ambulatory Visit: Payer: Self-pay

## 2024-03-13 ENCOUNTER — Ambulatory Visit
Admission: RE | Admit: 2024-03-13 | Discharge: 2024-03-13 | Disposition: A | Discharge status: Home / Self Care | Source: Ambulatory Visit | Attending: Family Medicine | Admitting: Family Medicine

## 2024-03-13 VITALS — BP 134/85 | HR 92 | Temp 98.7°F | Resp 16

## 2024-03-13 DIAGNOSIS — H1031 Unspecified acute conjunctivitis, right eye: Secondary | ICD-10-CM

## 2024-03-13 MED ORDER — POLYMYXIN B-TRIMETHOPRIM 10000-0.1 UNIT/ML-% OP SOLN: 1.0000 [drp] | OPHTHALMIC | 0 refills | Status: AC

## 2024-03-13 NOTE — ED Provider Notes (Signed)
 [facility]    CSN: [id number] Arrival date & time: 03/13/24  1312      History   Chief Complaint Chief Complaint  Patient presents with   Conjunctivitis    Not sure pink eye or allergies - Entered by patient    HPI Jamie Preston is a 43 y.o. female presents for eye drainage.  Patient states yesterday she had some bilateral eye itching but when she woke this morning her right eye was all red with a "jelly" type discharge.  She denies any itching to the right eye today or additional drainage.  No visual changes, eye pain, injury to the eye.  No photosensitivity.  Wears glasses.  Does have a history of allergies and states this feels different than her typical allergy symptoms.  She has not used any OTC medications since onset.  No other concerns at this time.   Conjunctivitis    Past Medical History:  Diagnosis Date   Kidney stones    Kidney stones    Migraine     There are no active problems to display for this patient.   Past Surgical History:  Procedure Laterality Date   NO PAST SURGERIES      OB History     Gravida  0   Para  0   Term  0   Preterm  0   AB  0   Living  0      SAB  0   IAB  0   Ectopic  0   Multiple  0   Live Births  0            Home Medications    Prior to Admission medications   Medication Sig Start Date End Date Taking? Authorizing Provider  trimethoprim-polymyxin b (POLYTRIM) ophthalmic solution Place 1 drop into the right eye every 4 (four) hours while awake for 7 days. 03/13/24 03/20/24 Yes Yalena Colon, Jodi R, NP  ibuprofen  (ADVIL ,MOTRIN ) 600 MG tablet Take 1 tablet (600 mg total) by mouth every 8 (eight) hours as needed. 08/19/18   Orvel Blanco, MD  traMADol  (ULTRAM ) 50 MG tablet Take 1 tablet (50 mg total) by mouth every 6 (six) hours as needed. Patient not taking: Reported on 08/19/2018 07/17/18   Scarlette Currier, MD    Family History No family history on file.  Social History Social History    Tobacco Use   Smoking status: Never   Smokeless tobacco: Never  Vaping Use   Vaping status: Never Used  Substance Use Topics   Alcohol use: Never   Drug use: Never     Allergies   Percocet [oxycodone -acetaminophen ]   Review of Systems Review of Systems  Eyes:  Positive for discharge and redness.     Physical Exam Triage Vital Signs ED Triage Vitals [03/13/24 1323]  Encounter Vitals Group     BP 134/85     Systolic BP Percentile      Diastolic BP Percentile      Pulse Rate 92     Resp 16     Temp 98.7 F (37.1 C)     Temp Source Oral     SpO2 97 %     Weight      Height      Head Circumference      Peak Flow      Pain Score 0     Pain Loc      Pain Education      Exclude from  Growth Chart    No data found.  Updated Vital Signs BP 134/85 (BP Location: Right Arm)   Pulse 92   Temp 98.7 F (37.1 C) (Oral)   Resp 16   LMP 02/21/2024 (Approximate)   SpO2 97%   Visual Acuity Right Eye Distance:   Left Eye Distance:   Bilateral Distance:    Right Eye Near:   Left Eye Near:    Bilateral Near:     Physical Exam Vitals and nursing note reviewed.  Constitutional:      General: She is not in acute distress.    Appearance: Normal appearance. She is not ill-appearing.  HENT:     Head: Normocephalic and atraumatic.  Eyes:     General: Lids are normal.        Right eye: No foreign body, discharge or hordeolum.     Conjunctiva/sclera:     Right eye: Right conjunctiva is injected. No chemosis, exudate or hemorrhage.    Pupils: Pupils are equal, round, and reactive to light.  Cardiovascular:     Rate and Rhythm: Normal rate.  Pulmonary:     Effort: Pulmonary effort is normal.  Skin:    General: Skin is warm and dry.  Neurological:     General: No focal deficit present.     Mental Status: She is alert and oriented to person, place, and time.  Psychiatric:        Mood and Affect: Mood normal.        Behavior: Behavior normal.      UC  Treatments / Results  Labs (all labs ordered are listed, but only abnormal results are displayed) Labs Reviewed - No data to display  EKG   Radiology No results found.  Procedures Procedures (including critical care time)  Medications Ordered in UC Medications - No data to display  Initial Impression / Assessment and Plan / UC Course  I have reviewed the triage vital signs and the nursing notes.  Pertinent labs & imaging results that were available during my care of the patient were reviewed by me and considered in my medical decision making (see chart for details).     Reviewed exam and symptoms with patient.  No red flags.  Discussed different types of conjunctivitis.  Given reported jellylike discharge will cover for bacterial process with Polytrim.  Instructed if no improvement after 2 days may stop this and start over-the-counter antihistamine eyedrops such as Clear Eyes.  Continue allergy medicine daily.  Warm compresses to the eye as needed.  PCP follow-up if symptoms do not improve.  ER precautions reviewed. Final Clinical Impressions(s) / UC Diagnoses   Final diagnoses:  Acute conjunctivitis of right eye, unspecified acute conjunctivitis type     Discharge Instructions      Start Polytrim antibiotic eyedrops to the right eye as prescribed.  If no improvement after 2 days you may stop this and start over-the-counter allergy eyedrops such as Clear Eyes.  Continue your allergy medicine daily.  Warm compresses to the eye as needed.  Please follow-up with your PCP if your symptoms do not improve.  Please go to the ER if you develop any worsening symptoms.  Hope you feel better soon!    ED Prescriptions     Medication Sig Dispense Auth. Provider   trimethoprim-polymyxin b (POLYTRIM) ophthalmic solution Place 1 drop into the right eye every 4 (four) hours while awake for 7 days. 10 mL Luellen Howson, Jodi R, NP      PDMP  not reviewed this encounter.   Alleen Arbour,  NP 03/13/24 [phone]

## 2024-03-13 NOTE — Discharge Instructions (Addendum)
 Start Polytrim antibiotic eyedrops to the right eye as prescribed.  If no improvement after 2 days you may stop this and start over-the-counter allergy eyedrops such as Clear Eyes.  Continue your allergy medicine daily.  Warm compresses to the eye as needed.  Please follow-up with your PCP if your symptoms do not improve.  Please go to the ER if you develop any worsening symptoms.  Hope you feel better soon!

## 2024-03-13 NOTE — ED Triage Notes (Signed)
 Pt present with c/o possible pink eye. Pt woke up this morning with irritation and itching on both eyes.

## 2024-03-19 ENCOUNTER — Encounter (HOSPITAL_BASED_OUTPATIENT_CLINIC_OR_DEPARTMENT_OTHER): Payer: Self-pay

## 2024-03-19 ENCOUNTER — Other Ambulatory Visit: Payer: Self-pay

## 2024-03-19 ENCOUNTER — Ambulatory Visit: Payer: Self-pay

## 2024-03-19 ENCOUNTER — Emergency Department (HOSPITAL_BASED_OUTPATIENT_CLINIC_OR_DEPARTMENT_OTHER): Admission: EM | Admit: 2024-03-19 | Discharge: 2024-03-19 | Disposition: A | Discharge status: Home / Self Care

## 2024-03-19 DIAGNOSIS — W448XXA Other foreign body entering into or through a natural orifice, initial encounter: Secondary | ICD-10-CM

## 2024-03-19 DIAGNOSIS — T192XXA Foreign body in vulva and vagina, initial encounter: Secondary | ICD-10-CM | POA: Insufficient documentation

## 2024-03-19 DIAGNOSIS — X58XXXA Exposure to other specified factors, initial encounter: Secondary | ICD-10-CM | POA: Insufficient documentation

## 2024-03-19 MED ORDER — CEPHALEXIN 500 MG PO CAPS: 500.0000 mg | ORAL_CAPSULE | Freq: Four times a day (QID) | ORAL | 0 refills | Status: AC

## 2024-03-19 NOTE — ED Triage Notes (Signed)
 Patient arrives ambulatory to the ED with complaints of having a tampon in vagina that she is unable to get out. States that the tampon has been in place for ~5 days. No pain or fevers.

## 2024-03-19 NOTE — ED Provider Notes (Signed)
  [facility] Provider Note   CSN: [id number] Arrival date & time: 03/19/24  0901     History  Chief Complaint  Patient presents with   Foreign Body in Vagina    Jamie Preston is a 43 y.o. female.  The history is provided by the patient and medical records. No language interpreter was used.  Foreign Body in Vagina     43 year old female presenting with concerns of retained tampon.  Patient believes patient has had a retained tampon that has been there for at least 5 days.  Patient states that she feels with her menstruation about 5 days ago.  She noticed some vaginal discharge that is different from her usual discharge.  She then felt a tampon that was retained in her vagina that she was unable to manually extract prompting this ER visit.  She does not endorse any fever or chills no abdominal pain no nausea no vomiting no burning urination and no other complaint.  She denies any new sexual partner.  She denies any strong odor.  Home Medications Prior to Admission medications   Medication Sig Start Date End Date Taking? Authorizing Provider  ibuprofen  (ADVIL ,MOTRIN ) 600 MG tablet Take 1 tablet (600 mg total) by mouth every 8 (eight) hours as needed. 08/19/18   Orvel Blanco, MD  traMADol  (ULTRAM ) 50 MG tablet Take 1 tablet (50 mg total) by mouth every 6 (six) hours as needed. Patient not taking: Reported on 08/19/2018 07/17/18   Scarlette Currier, MD  trimethoprim -polymyxin b  (POLYTRIM ) ophthalmic solution Place 1 drop into the right eye every 4 (four) hours while awake for 7 days. 03/13/24 03/20/24  Mayer, Jodi R, NP      Allergies    Percocet [oxycodone -acetaminophen ]    Review of Systems   Review of Systems  All other systems reviewed and are negative.   Physical Exam Updated Vital Signs BP (!) 168/101 (BP Location: Right Arm)   Resp 20   Ht 5\' 7"  (1.702 m)   Wt 103.4 kg   LMP 03/12/2024 (Approximate)   SpO2 100%   BMI 35.71  kg/m  Physical Exam Vitals and nursing note reviewed.  Constitutional:      General: She is not in acute distress.    Appearance: She is well-developed.  HENT:     Head: Atraumatic.  Eyes:     Conjunctiva/sclera: Conjunctivae normal.  Abdominal:     Palpations: Abdomen is soft.     Tenderness: There is no abdominal tenderness.  Musculoskeletal:     Cervical back: Neck supple.  Skin:    Findings: No rash.  Neurological:     Mental Status: She is alert.     ED Results / Procedures / Treatments   Labs (all labs ordered are listed, but only abnormal results are displayed) Labs Reviewed - No data to display  EKG None  Radiology No results found.  Procedures .Pelvic exam  Date/Time: 03/19/2024 9:46 AM  Performed by: Debbra Fairy, PA-C Authorized by: Debbra Fairy, PA-C  Comments: Chaperone present during exam.  No inguinal lymphadenopathy or inguinal hernia noted.  Normal external genitalia.  No significant discomfort with speculum insertion.  I was able to visualize a retained tampon lodged in the posterior fornix.  The tampon was removed using a ring forcep.  Careful inspection was done after removal of the retained tampon.  There were foul odor discharge noted but no significant signs of trauma to the vaginal vault.  Difficult to  visualize cervical os.  No tenderness on bimanual exam.       Medications Ordered in ED Medications - No data to display  ED Course/ Medical Decision Making/ A&P                                 Medical Decision Making  BP (!) 168/101 (BP Location: Right Arm)   Pulse 86   Temp 98.7 F (37.1 C) (Oral)   Resp 20   Ht 5\' 7"  (1.702 m)   Wt 103.4 kg   LMP 03/12/2024 (Approximate)   SpO2 100%   BMI 35.71 kg/m   43:36 AM  43 year old female presenting with concerns of retained tampon.  Patient believes patient has had a retained tampon that has been there for at least 5 days.  Patient states that she feels with her menstruation about 5 days  ago.  She noticed some vaginal discharge that is different from her usual discharge.  She then felt a tampon that was retained in her vagina that she was unable to manually extract prompting this ER visit.  She does not endorse any fever or chills no abdominal pain no nausea no vomiting no burning urination and no other complaint.  She denies any new sexual partner.  She denies any strong odor.  On exam, patient is resting comfortably appears to be in no acute discomfort.  Her abdomen is soft and nontender.  Vital sign notable for elevated blood pressure of 168/101.  Patient is afebrile with no hypoxia.  Currently low suspicion for toxic shock syndrome.  9:48 AM Retained tampon was successfully extracted using ring forcep.  Patient tolerates procedure without difficulty.  Patient denies any new sexual partner and she reports low concern for any STI therefore no additional STI screening was performed.  I will prescribe patient antibiotic to have on hand in the event that she develop any infective symptoms and I gave patient strict return precaution.  Patient was then not to take antibiotic unless she develops symptoms.        Final Clinical Impression(s) / ED Diagnoses Final diagnoses:  Retained tampon, initial encounter    Rx / DC Orders ED Discharge Orders          Ordered    cephALEXin  (KEFLEX ) 500 MG capsule  4 times daily        03/19/24 0949              Debbra Fairy, PA-C 03/19/24 0950    Rolinda Climes, DO 03/19/24 1113

## 2024-03-19 NOTE — Discharge Instructions (Signed)
 You have a retained tampon that was removed during this ER visit.  If you develop abdominal discomfort or signs of infection please take antibiotic as prescribed and return to the ER for further assessment.
